# Patient Record
Sex: Female | Born: 1935 | Race: White | Hispanic: No | Smoking: Never smoker
Health system: Southern US, Community
[De-identification: ages and names within clinical notes are randomized; demographics above are authoritative.]

## PROBLEM LIST (undated history)

## (undated) DIAGNOSIS — G20A1 Parkinson's disease without dyskinesia, without mention of fluctuations: Secondary | ICD-10-CM

## (undated) DIAGNOSIS — K5900 Constipation, unspecified: Secondary | ICD-10-CM

## (undated) DIAGNOSIS — G259 Extrapyramidal and movement disorder, unspecified: Secondary | ICD-10-CM

## (undated) DIAGNOSIS — F0390 Unspecified dementia without behavioral disturbance: Secondary | ICD-10-CM

## (undated) DIAGNOSIS — K4090 Unilateral inguinal hernia, without obstruction or gangrene, not specified as recurrent: Secondary | ICD-10-CM

## (undated) DIAGNOSIS — G20 Parkinson's disease: Secondary | ICD-10-CM

## (undated) DIAGNOSIS — F419 Anxiety disorder, unspecified: Secondary | ICD-10-CM

## (undated) DIAGNOSIS — R413 Other amnesia: Secondary | ICD-10-CM

## (undated) DIAGNOSIS — M1990 Unspecified osteoarthritis, unspecified site: Secondary | ICD-10-CM

## (undated) HISTORY — PX: BACK SURGERY: SHX140

## (undated) HISTORY — PX: COLONOSCOPY: SHX174

## (undated) HISTORY — DX: Extrapyramidal and movement disorder, unspecified: G25.9

## (undated) HISTORY — PX: HERNIA REPAIR: SHX51

## (undated) HISTORY — DX: Other amnesia: R41.3

## (undated) HISTORY — PX: EYE SURGERY: SHX253

---

## 2010-03-12 ENCOUNTER — Encounter: Payer: Self-pay | Admitting: Gastroenterology

## 2010-09-04 ENCOUNTER — Encounter (INDEPENDENT_AMBULATORY_CARE_PROVIDER_SITE_OTHER): Payer: Self-pay | Admitting: *Deleted

## 2010-09-12 ENCOUNTER — Ambulatory Visit: Payer: Self-pay | Admitting: Gastroenterology

## 2010-09-12 DIAGNOSIS — G20 Parkinson's disease: Secondary | ICD-10-CM

## 2010-09-12 DIAGNOSIS — K5900 Constipation, unspecified: Secondary | ICD-10-CM | POA: Insufficient documentation

## 2010-09-12 DIAGNOSIS — Z860100 Personal history of colon polyps, unspecified: Secondary | ICD-10-CM | POA: Insufficient documentation

## 2010-09-12 DIAGNOSIS — M549 Dorsalgia, unspecified: Secondary | ICD-10-CM | POA: Insufficient documentation

## 2010-09-12 DIAGNOSIS — F411 Generalized anxiety disorder: Secondary | ICD-10-CM | POA: Insufficient documentation

## 2010-09-12 DIAGNOSIS — R112 Nausea with vomiting, unspecified: Secondary | ICD-10-CM | POA: Insufficient documentation

## 2010-09-13 DIAGNOSIS — R634 Abnormal weight loss: Secondary | ICD-10-CM | POA: Insufficient documentation

## 2010-09-14 ENCOUNTER — Telehealth: Payer: Self-pay | Admitting: Gastroenterology

## 2010-09-21 ENCOUNTER — Encounter: Payer: Self-pay | Admitting: Gastroenterology

## 2010-09-28 ENCOUNTER — Telehealth: Payer: Self-pay | Admitting: Gastroenterology

## 2010-10-01 ENCOUNTER — Ambulatory Visit (HOSPITAL_COMMUNITY): Admission: RE | Admit: 2010-10-01 | Discharge: 2010-10-01 | Payer: Self-pay | Admitting: Specialist

## 2010-10-02 ENCOUNTER — Encounter: Payer: Self-pay | Admitting: Gastroenterology

## 2010-10-09 ENCOUNTER — Telehealth: Payer: Self-pay | Admitting: Gastroenterology

## 2010-10-22 ENCOUNTER — Telehealth: Payer: Self-pay | Admitting: Gastroenterology

## 2010-10-24 ENCOUNTER — Encounter: Payer: Self-pay | Admitting: Gastroenterology

## 2010-11-26 ENCOUNTER — Telehealth: Payer: Self-pay | Admitting: Gastroenterology

## 2011-01-15 NOTE — Letter (Signed)
Summary: Results Letter  [facility]  [address] [place], [place] [address]   Phone: [phone]  Fax: [phone]        September 12, 2010 MRN: [id number]    Heather Mora [address] [place], [place]  [address]    Dear [name],  It is my pleasure to have treated you recently as a new patient in my office. I appreciate your confidence and the opportunity to participate in your care.  Since I do have a busy inpatient endoscopy schedule and office schedule, my office hours vary weekly. I am, however, available for emergency calls everyday through my office. If I am not available for an urgent office appointment, another one of our gastroenterologist will be able to assist you.  My well-trained staff are prepared to help you at all times. For emergencies after office hours, a physician from our Gastroenterology section is always available through my 24 hour answering service  Once again I welcome you as a new patient and I look forward to a happy and healthy relationship             Sincerely,  Louis Meckel MD  This letter has been electronically signed by your physician.  Appended Document: Results Letter letter mailed

## 2011-01-15 NOTE — Progress Notes (Signed)
Summary: Nutrition consult  ---- Converted from flag ---- ---- 09/28/2010 9:06 AM, Louis Meckel MD wrote: Please be sure the patient has a nutrition consult and a followup appointment within the next 3-4 weeks ------------------------------  Phone Note Outgoing Call Call back at [facility] Phone [phone]   Call placed by: [name] CMA [name]),  September 28, 2010 9:15 AM Summary of Call: [name] to see if pt had been seen yet, they stated that the pt has not returned any of there phone calls. I spoke with the pt's daughter and she said they have not cotacted them. Pts daughter is to call Alden Server back at [phone] to get her moms appointment scheduled..... Initial call taken by: [name] CMA [name]),  September 28, 2010 9:17 AM

## 2011-01-15 NOTE — Miscellaneous (Signed)
  Clinical Lists Changes Old medical records reviewed.  Patient has well-established gastroparesis.  She is taking domperidone in the past without improvement.  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  She underwent Botox injection of the pyloric sphincter without improvement.

## 2011-01-15 NOTE — Progress Notes (Signed)
Summary: Ct scan   Phone Note Call from Patient   Caller: Daughter carolyn  [phone] Call For: Dr. Arlyce Dice Reason for Call: Lab or Test Results Summary of Call: Calling about CT scan that was left with Dr. Arlyce Dice on the last visit Initial call taken by: Karna Christmas,  October 09, 2010 10:17 AM  Follow-up for Phone Call        Returned pts daughters call. L/M for her that we still have the pts CT scan on the disk that was left if she wants to come by and pick them up Follow-up by: [name] CMA [name]),  October 09, 2010 12:02 PM

## 2011-01-15 NOTE — Letter (Signed)
Summary: New Patient letter  [facility]  [address] [place], [place] [address]   Phone: [phone]  Fax: [phone]       09/04/2010 MRN: [id number]  Heather Mora [address] [place], [place]  [address]  Dear Ms. Hommes,  Welcome to the Gastroenterology Division at [facility].    You are scheduled to see Dr.  Arlyce Dice on 09-12-10 at 3:00p.m. on the 3rd floor at [facility], [address]  We ask that you try to arrive at our office 15 minutes prior to your appointment time to allow for check-in.  We would like you to complete the enclosed self-administered evaluation form prior to your visit and bring it with you on the day of your appointment.  We will review it with you.  Also, please bring a complete list of all your medications or, if you prefer, bring the medication bottles and we will list them.  Please bring your insurance card so that we may make a copy of it.  If your insurance requires a referral to see a specialist, please bring your referral form from your primary care physician.  Co-payments are due at the time of your visit and may be paid by cash, check or credit card.     Your office visit will consist of a consult with your physician (includes a physical exam), any laboratory testing he/she may order, scheduling of any necessary diagnostic testing (e.g. x-ray, ultrasound, CT-scan), and scheduling of a procedure (e.g. Endoscopy, Colonoscopy) if required.  Please allow enough time on your schedule to allow for any/all of these possibilities.    If you cannot keep your appointment, please call [phone] to cancel or reschedule prior to your appointment date.  This allows [place] the opportunity to schedule an appointment for another patient in need of care.  If you do not cancel or reschedule by 5 p.m. the business day prior to your appointment date, you will be charged a $50.00 late cancellation/no-show fee.    Thank you for choosing  [facility] for your medical needs.  We appreciate the opportunity to care for you.  Please visit [place] at our website  to learn more about our practice.                     Sincerely,                                                             The Gastroenterology Division

## 2011-01-15 NOTE — Progress Notes (Signed)
Summary: Medication refill  Phone Note Call from Patient Call back at Home Phone [phone]   Caller: Patient Call For: Dr. Arlyce Dice Reason for Call: Refill Medication Summary of Call: Needs refill on his Fentanyl Initial call taken by: Karna Christmas,  October 09, 2010 4:45 PM  Follow-up for Phone Call        Can pt have refill on Fentynal patch? Follow-up by: [name] CMA [name]),  October 09, 2010 4:49 PM  Additional Follow-up for Phone Call Additional follow up Details #1::        Can refill this time but tell her that she needs to have it done by her PCP in the future. Needs OV Additional Follow-up by: Louis Meckel MD,  October 10, 2010 10:36 AM    Additional Follow-up for Phone Call Additional follow up Details #2::    Called pt to inform, daughter will pick up script and already has a follow up appointment with Dr Arlyce Dice on 11/20/2010 Follow-up by: [name] CMA [name]),  October 10, 2010 10:49 AM  New/Updated Medications: DURAGESIC-25 25 MCG/HR PT72 (FENTANYL) apply every 72 hours Prescriptions: DURAGESIC-25 25 MCG/HR PT72 (FENTANYL) apply every 72 hours  #15 x 0   Entered by:   [name] CMA (AAMA)   Authorized by:   Louis Meckel MD   Signed by:   [name] CMA (AAMA) on 10/10/2010   Method used:   Printed then faxed to ...       [facility]  [place] [address]* (retail)       [address]       [place], [place]  [address]       Ph: [phone] or [phone]       Fax: [phone]   RxID:   [id number]

## 2011-01-15 NOTE — Progress Notes (Signed)
Summary: [facility]  Phone Note Outgoing Call Call back at [facility] Phone [phone] Call back at [facility] [phone]   Call placed by: [name] CMA [name]),  September 14, 2010 12:48 PM Summary of Call: Called to make sure that they recieved information on pt. They recieved all faxed info and ae working on getting her scheduled in. Will contact me as soon as they schedule her, Also faxed Dr Arlyce Dice cell # for the doctor to contact Dr Arlyce Dice Initial call taken by: [name] CMA [name]),  September 14, 2010 12:53 PM  Follow-up for Phone Call        FYI pt has an appointment with [facility] on Friday 10/7 at 2pm  Pt aware. Follow-up by: [name] CMA [name]),  September 17, 2010 9:05 AM  Additional Follow-up for Phone Call Additional follow up Details #1::        ok Additional Follow-up by: Louis Meckel MD,  September 17, 2010 9:15 AM

## 2011-01-15 NOTE — Letter (Signed)
Summary: [facility]  [facility]   Imported By: Sherian Rein 10/03/2010 10:25:07  _____________________________________________________________________  External Attachment:    Type:   Image     Comment:   External Document

## 2011-01-15 NOTE — Progress Notes (Signed)
Summary: Phone Message  Phone Note Other Incoming Call back at [facility] Phone [phone]   Caller: Patient's daughter Details for Reason: CT Scan Results Summary of Call: Pt's daughter, Heather Mora, called regarding CT scan. She wanted to know if Dr. Arlyce Dice had read them and what his findings were. Please give daughter a call. Initial call taken by: Schuyler Amor,  October 22, 2010 10:49 AM  Follow-up for Phone Call        Dr Arlyce Dice, We dont have a written report we have the disk, Did you review it? I still have it if you need to look at it.  Follow-up by: [name] CMA [name]),  October 22, 2010 11:18 AM  Additional Follow-up for Phone Call Additional follow up Details #1::        Converted from flag ---- ---- 10/23/2010 9:35 AM, Louis Meckel MD wrote: Please leave me the disc ------------------------------  I put it on your desk Additional Follow-up by: [name] CMA [name]),  October 23, 2010 9:43 AM    Additional Follow-up for Phone Call Additional follow up Details #2::    I reviewed the CT scan.  There no specific findings referable to her GI tract.  She needs followup GI office visit  Follow-up by: Louis Meckel MD,  October 24, 2010 10:00 AM  Additional Follow-up for Phone Call Additional follow up Details #3:: Details for Additional Follow-up Action Taken: Contacted pts daughter to inform her of the CT Scan. Pt has an appointment with Dr Arlyce Dice on 12-6 will pick up CT Disk at that time Additional Follow-up by: [name] CMA [facility]),  October 25, 2010 8:30 AM

## 2011-01-15 NOTE — Assessment & Plan Note (Signed)
Summary: ESTABLISH G.I//NO APPETITE--CH.   History of Present Illness Visit Type: Initial Visit Primary GI MD: Melvia Heaps MD [facility] Primary Rodderick Holtzer: n/a Requesting Ryoma Nofziger: n/a Chief Complaint: Patient lives here every 3 months and lives in [place] the other times. Patinet was on Domperidone for gastroparesis but was told to stop due to her Parkinson. She has a complex history and she brought her records. She has constant nausea she has had the botox injections and is very limited as to what she can take due pt her Parkinsons. She only eats one egg a day and drinks 2 ensures a day.   History of Present Illness:   Heather Mora is a pleasant 77 year old white female self-referred for evaluation of anorexia.  She has had Parkinson's disease for 8 years.  She has  gastroparesis and has been extensively worked up in [place].  She went to a motility center at [facility] where it was felt that she was not a candidate for gastric pacemaker.  She had taking domperidone without improvement and was eventually told to stop this because of her Parkinson's.  She has constant anorexia, nausea and vomiting..  She typically will have 18 and  ensures daily.  She has lost 10 pounds over the past year but another 10 pounds over the last 2 months.  She underwent a Botox injection of her pylorus without improvement in her symptoms.  In the last 3 months her  neurologic disease has significantly worsened,  according to her daughter.  She is having constant movements.  She also suffers from severe anxiety.  She denies abdominal pain but complains of constipation.  Upper endoscopy 2 weeks ago in [place] demonstrated atrophic gastritis and reflux esophagitis.  She apparently underwent colonoscopy2  to 3 years ago because of family history of colon cancer and polyps in 2 of her siblings.  She is on Percocet 4 times a day for chronic back pain.   She takes Sinemet 25-250 2-1/2 tabs 3 times a day.   A prealbumin  level drawn 2 weeks ago was normal.  She denies dysphagia.   GI Review of Systems    Reports abdominal pain, belching, bloating, loss of appetite, nausea, vomiting, and  weight loss.     Location of  Abdominal pain: lower abdomen. Weight loss of 8-10lbs pounds over 3 months.   Denies acid reflux, chest pain, dysphagia with liquids, dysphagia with solids, heartburn, vomiting blood, and  weight gain.      Reports constipation.     Denies anal fissure, black tarry stools, change in bowel habit, diarrhea, diverticulosis, fecal incontinence, heme positive stool, hemorrhoids, irritable bowel syndrome, jaundice, light color stool, liver problems, rectal bleeding, and  rectal pain. Preventive Screening-Counseling & Management  Alcohol-Tobacco     Smoking Status: never      Drug Use:  no.      Current Medications (verified): 1)  Sinemet 25-250 Mg Tabs (Carbidopa-Levodopa) .... Take 2.5 Tabs By Mouth Every Morning, 2 Tabs in The Afternoon, and 2 Tabs By Mouth At Bedtime 2)  Norco 10-325 Mg Tabs (Hydrocodone-Acetaminophen) .... Take One By Mouth Four Times A Day 3)  Alprazolam 0.25 Mg Tabs (Alprazolam) .... Take One By Mouth Two Times A Day 4)  Paxil 10 Mg Tabs (Paroxetine Hcl) .... Take One By Mouth At Bedtime 5)  Ambien 10 Mg Tabs (Zolpidem Tartrate) .... Take 1/2 By Mouth At Bedtime 6)  Carafate 1 Gm/62ml Susp (Sucralfate) .... Take 2 Teaspoons Before Meals and  Bed  Allergies (verified): No Known Drug Allergies  Past History:  Past Medical History: Parkinsons chroinc back pain gastroparesis  Hypertension colon polyps.  Past Surgical History: back surgery hernia surgery 03/2010  Family History: Family History of Colon Cancer: brothers Family History of Colon Polyps:brothers Family History of Heart Disease: brother Parkinsons: brother  Social History: Occupation: retired one son and three girls Patient has never smoked.  Alcohol Use - no Illicit Drug Use - no Smoking  Status:  never Drug Use:  no  Review of Systems       The patient complains of anxiety-new, arthritis/joint pain, back pain, confusion, depression-new, fatigue, muscle pains/cramps, night sweats, shortness of breath, sleeping problems, thirst - excessive, and urination changes/pain.  The patient denies allergy/sinus, anemia, blood in urine, breast changes/lumps, change in vision, cough, coughing up blood, fainting, fever, headaches-new, hearing problems, heart murmur, heart rhythm changes, itching, menstrual pain, nosebleeds, pregnancy symptoms, skin rash, sore throat, swelling of feet/legs, swollen lymph glands, thirst - excessive , urination - excessive , urine leakage, vision changes, and voice change.    Vital Signs:  Patient profile:   77 year old female Height:      63 inches Weight:      81.2 pounds BMI:     14.44 Pulse rate:   80 / minute Pulse rhythm:   regular BP sitting:   92 / 44  (left arm) Cuff size:   regular  Vitals Entered By: Harlow Mares CMA [name]) (September 12, 2010 3:39 PM)  Physical Exam  Additional Exam:  on physical exam she is a very thin female  skin: anicteric HEENT: normocephalic; PEERLA; no nasal or pharyngeal abnormalities neck: supple nodes: no cervical lymphadenopathy chest: clear to ausculatation and percussion heart: no murmurs, gallops, or rubs abd: soft, nontender; BS normoactive; no abdominal masses, tenderness, organomegaly rectal: deferred ext: no cynanosis, clubbing, edema skeletal: no deformities neuro: oriented x 3; no focal abnormalities; varicosities choreoathetoid movements    Impression & Recommendations:  Problem # 1:  NAUSEA WITH VOMITING (ICD-787.01) The patient's symptoms certainly can be explained by gastroparesis.  Constant anorexia with nausea may also be due to medication effects including her narcotics and Sinemet.  Symptoms did not improve with domperidone and one neurologists stated  this was contraindicated in the  presence of Parkinson's.  Recommendations #1 neurologic referral for reevaluation #2 begin  fentanyl patch #3 nutrition consult #4 patien tmay require a gastrostomy tube if her nutritional needs cannot be met  Problem # 2:  CONSTIPATION (ICD-564.00) Patient will try MiraLax and titrate her dose.  Constipation is related to both her Parkinson's disease and medications  Problem # 3:  FM HX MALIGNANT NEOPLASM GASTROINTESTINAL TRACT (ICD-V16.0) Assessment: Comment Only Patient is due for followup colonoscopy in approximately 3 years  Problem # 4:  PARKINSON'S DISEASE (ICD-332.0) Her choreoathetoid movements are not typical for Parkinson's disease.  This could be a medication effect from her Sinemet.  Recommendations #1 neurology consult  Problem # 5:  ANXIETY (ICD-300.00) Plan to continue alprazolam as needed  Problem # 6:  BACK PAIN, LUMBOSACRAL, CHRONIC (ICD-724.5) Plan orthopedic referral  Patient Instructions: 1)  We will call you with referral times for [facility] and for [facility] as soon as they become available 2)  The medication list was reviewed and reconciled.  All changed / newly prescribed medications were explained.  A complete medication list was provided to the patient / caregiver. Prescriptions: DURAGESIC-25 25 MCG/HR PT72 (FENTANYL) apply  every 72 hours  #15 x 0   Entered and Authorized by:   Louis Meckel MD   Signed by:   Louis Meckel MD on 09/12/2010   Method used:   Print then Give to Patient   RxID:   [id number]   Appended Document: ESTABLISH G.I//NO APPETITE--CH.    Clinical Lists Changes  Problems: Added new problem of WEIGHT LOSS, ABNORMAL (ICD-783.21)      Appended Document: ESTABLISH G.I//NO APPETITE--CH.    Clinical Lists Changes  Orders: Added new Test order of [facility] ([facility]) - Signed      Appended Document: ESTABLISH G.I//NO  APPETITE--CH.    Clinical Lists Changes  Orders: Added new Test order of Misc. Referral (Misc. Ref) - Signed [facility]     Appended Document: ESTABLISH G.I//NO APPETITE--CH.    Clinical Lists Changes  Orders: Added new Test order of [facility] ([facility] - Signed

## 2011-01-15 NOTE — Letter (Signed)
Summary: Consult/[facility]  Consult/[facility]   Imported By: Sherian Rein 10/03/2010 10:23:36  _____________________________________________________________________  External Attachment:    Type:   Image     Comment:   External Document

## 2011-01-15 NOTE — Letter (Signed)
Summary: Patient No Show/[facility]  [facility] Show/ [facility]   Imported By: Lennie Odor 10/10/2010 13:47:35  _____________________________________________________________________  External Attachment:    Type:   Image     Comment:   External Document

## 2011-01-17 NOTE — Progress Notes (Signed)
Summary: Pickup CT scan disk  Phone Note Call from Patient Call back at Home Phone [phone]   Caller: Daughter Heather Mora Call For: Dr. Arlyce Dice Reason for Call: Talk to Nurse Summary of Call: wants to come by and pickup her CT scan disk  Follow-up for Phone Call        Spoke with Heather Mora pt to pick up disk today, Pts brother passed away this weekend and she will be in [place] until March 2012. Wants disk to have in case she needs it while in [place] Follow-up by: [name] CMA [name]),  November 26, 2010 10:56 AM

## 2013-07-26 ENCOUNTER — Encounter: Payer: Self-pay | Admitting: Diagnostic Neuroimaging

## 2013-07-26 ENCOUNTER — Ambulatory Visit (INDEPENDENT_AMBULATORY_CARE_PROVIDER_SITE_OTHER): Payer: Medicare Other | Admitting: Diagnostic Neuroimaging

## 2013-07-26 VITALS — BP 145/78 | HR 83 | Ht 64.0 in | Wt 83.0 lb

## 2013-07-26 DIAGNOSIS — G20A1 Parkinson's disease without dyskinesia, without mention of fluctuations: Secondary | ICD-10-CM | POA: Insufficient documentation

## 2013-07-26 DIAGNOSIS — G20 Parkinson's disease: Secondary | ICD-10-CM | POA: Insufficient documentation

## 2013-07-26 DIAGNOSIS — F0280 Dementia in other diseases classified elsewhere without behavioral disturbance: Secondary | ICD-10-CM

## 2013-07-26 NOTE — Patient Instructions (Signed)
Continue current meds.  Try physical therapy in Oct 2014.

## 2013-07-26 NOTE — Progress Notes (Addendum)
[facility]  PATIENT: Heather Mora DOB: Apr 03, 1930  REFERRING CLINICIAN:  HISTORY FROM: patient and daughter REASON FOR VISIT: follow up   HISTORICAL  CHIEF COMPLAINT:  Chief Complaint  Patient presents with  . Follow-up    PD RV#[id number]    HISTORY OF PRESENT ILLNESS:   UPDATE 07/26/13: Since last visit patient is complaining of progressive Parkinson's disease. She feels that her balance and memory had deteriorated. Now patient is on Neupro patch, Exelon patch, Namenda XR 20 mg daily. Patient still on carbidopa/levodopa 25/101.5 tablets 3 times per day. She was tried on 50/200 extended release tablet at bedtime to help with early morning freezing and slowness, but this caused insomnia and dyskinesia. Patient continues to gradually lose weight. She's lost another 5 pounds over last 2 years. She feels low but off-balance. Unfortunately she's not having falls. Seeing Dr. Donell Beers (psych).  UPDATE 08/21/11: Daughter noticing memory problems.  Pt never increased sinemet dosing from last visit.  Still with GI upset.  Afternoons are better.  No wearing off b/w dosing.  Pt getting ready to go to [place] next week for 2-3 months.  UPDATE 03/21/11: Doing about the same.  Eating better.  Still with some slowness in AM, gets better in the afternoon/evening.  No wearing off.  Some memory problems.  UPDATE 11/14/10: Doing much better than last visit.  PO intake better.  on sinemet 25/100 1.5 tabs TID (7:30 AM, 12:30 PM, 6:30 PM); asking if she can take 2 tabs in the AM, b/c of bradykinesia in AM.  No wearing off b/w doses.  No tremor.  Pain and spasm controlled on fentanyl and flexeril.  Some report of BP fluctuation.  UPDATE 09/21/10: Patient has had several setbacks and overall decline.  Nausea is persistent.  PO intake is poor.  Weight loss continue.  Also Several months ago she began to develop increasing dyskinesias.  This was particularly evident after her pain medication and  anti-anxiety medication had been increased. She was previously on Sinemet 25/100 (2.5, 2, 2 tabs) but now her daughter reduced this to 1.5 tabs TID (7:30 AM, 12:30 PM, 6:30 PM), and her dyskinesias have largely subsided.  Yesterday she started fentanyl patch , and this morning had much worsened nausea and confusion.  Patch was removed and now things are starting to improve.  Dyskinesias are present now, but as bad as they were on higher sinemet.  Right now the patient's top 3 complaints are: 1. back pain 2. anxiety and 3. nausea.  PRIOR HPI: 79 year old right-handed female with history of Parkinson's disease presenting for second opinion evaluation.  Patient is accompanied by her daughter.  Patient is normally seen by her neurologist Dr. Criss Rosales in [place].  The patient developed left thumb tremor approximately 7 years ago. She was subsequently diagnosed with Parkinson's disease and started on Sinemet.  Initially she had good response. Approximately 3 years ago she had to increase her Sinemet dose because she was having reduced efficacy.  Past one to 2 years she has developed intractable nausea, gastroparesis and constipation. She has had extensive GI evaluation, testing and treatment, including Botox injections.  Currently she is on domperidone 10mg  (2 tabs TID).  Currently she is on Sinemet 25/100, 2.5 tabs in the morning, 2 tablets at noon and 2 tabs in the evening.  She does have slowness and stiffness of movements in the morning before she takes her medication.  She achieves good relief of symptoms 2 hours after the first dose. By  noon she exhibits wearing off symptoms. She does not have significant wearing off symptoms prior to her evening dose.  She has tried azilect in the past, but had to stop because of cost.  She has not been on requip or mirapex.  She has been taking coenzyme Q10.  She has been having problems with anxiety and panic attacks recently. This happened when she was at dinner with her  family last week.  REVIEW OF SYSTEMS: Full 14 system review of systems performed and notable only for palpitations blurred vision eye pain and constipation aching muscle increased thirst anxiety tremor dizziness weakness numbness headache confusion memory loss.  ALLERGIES: No Known Allergies  HOME MEDICATIONS: Prior to Admission medications   Medication Sig Start Date End Date Taking? Authorizing Provider  rivastigmine (EXELON) 4.6 mg/24hr Place 1 patch onto the skin daily.   Yes Historical Provider, MD  carbidopa-levodopa (SINEMET CR) 50-200 MG per tablet  05/25/13   Historical Provider, MD  cyclobenzaprine (FLEXERIL) 10 MG tablet  07/20/13   Historical Provider, MD  LORazepam (ATIVAN) 0.5 MG tablet  07/21/13   Historical Provider, MD  NAMENDA XR 28 MG CP24  07/21/13   Historical Provider, MD  NEUPRO 4 MG/24HR  07/06/13   Historical Provider, MD   No outpatient prescriptions prior to visit.   No facility-administered medications prior to visit.    PAST MEDICAL HISTORY: Past Medical History  Diagnosis Date  . Movement disorder   . Memory loss     PAST SURGICAL HISTORY: Past Surgical History  Procedure Laterality Date  . Hernia repair    . Back surgery      FAMILY HISTORY: Family History  Problem Relation Age of Onset  . Parkinsonism Brother     SOCIAL HISTORY:  History   Social History  . Marital Status: Widowed    Spouse Name: N/A    Number of Children: N/A  . Years of Education: N/A   Occupational History  . Not on file.   Social History Main Topics  . Smoking status: Not on file  . Smokeless tobacco: Not on file  . Alcohol Use: Not on file  . Drug Use: Not on file  . Sexually Active: Not on file   Other Topics Concern  . Not on file   Social History Narrative   79year old right handed female wo is retired. She has a high school education level.  She is widowed.  She has one alcoholic drink daily. Drinks  1 cup tea daily     PHYSICAL EXAM  Filed  Vitals:   07/26/13 1152  BP: 145/78  Pulse: 83  Height: 5\' 4"  (1.626 m)  Weight: 83 lb (37.649 kg)    Not recorded    Body mass index is 14.24 kg/(m^2).  GENERAL EXAM: General: Patient is awake, alert and in no acute distress.  Well developed and groomed.  THIN FEMALE. Cardiovascular: Heart is regular rate and rhythm with no murmurs. 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  Neurologic Exam  Mental Status: Awake, alert.  Language is fluent and comprehension intact. MMSE 24/30. AFT 5. GDS 3. Cranial Nerves: Pupils are equal and reactive to light.  Visual fields are full to confrontation.  Conjugate eye movements are full and symmetric.  Facial sensation and strength are symmetric.  Hearing is intact.  Palate elevated symmetrically and uvula is midline.  Shoulder shug is symmetric.  Tongue is midline. Motor: DECREASED BULK. Normal tone.  Full strength in the upper and  lower extremities.  No pronator drift. BRADYKINESIA IN LUE AND LLE.  MILD COGWHEELING IN BUE. NO TREMOR OR DYSKINESIA. Sensory: Intact and symmetric to light touch. Coordination: No ataxia or dysmetria on finger-nose or rapid alternating movement testing. Gait and Station: Narrow based gait. SLOW, POOR ARM SWING.  Reflexes: Deep tendon reflexes in the upper and lower extremity are present and symmetric.   DIAGNOSTIC DATA (LABS, IMAGING, TESTING) - I reviewed patient records, labs, notes, testing and imaging myself where available.  No results found for this basename: WBC, HGB, HCT, MCV, PLT   No results found for this basename: na, k, cl, co2, glucose, bun, creatinine, calcium, prot, albumin, ast, alt, alkphos, bilitot, gfrnonaa, gfraa   No results found for this basename: CHOL, HDL, LDLCALC, LDLDIRECT, TRIG, CHOLHDL   No results found for this basename: HGBA1C   No results found for this basename: VITAMINB12   No results found for this basename: TSH     ASSESSMENT AND PLAN  79 y.o. with  progressive Parkinson's disease. Motor symptoms stable. No major wearing off for now. Some non-motor symptoms BP fluctuation and anxiety.  She had to stop azilect due to cost.     PLAN: 1. Continue carbidopa/levodopa 25/100 (1.5tabs TID) and neupro patch 2. Continue namenda and exelon for dementia/memory 3. PT evaluation (Oct 2014)   Return in about 6 months (around 01/26/2014) for with Heide Guile or Penumalli.    Suanne Marker, MD 07/26/2013, 12:56 PM Certified in Neurology, Neurophysiology and Neuroimaging  [facility] [address], [address] [place], [place] [address] [phone]

## 2013-12-02 ENCOUNTER — Ambulatory Visit: Payer: Self-pay | Admitting: Diagnostic Neuroimaging

## 2013-12-30 ENCOUNTER — Ambulatory Visit: Payer: Medicare Other | Admitting: Diagnostic Neuroimaging

## 2014-04-18 ENCOUNTER — Ambulatory Visit: Payer: Self-pay | Admitting: Diagnostic Neuroimaging

## 2014-10-24 ENCOUNTER — Encounter (HOSPITAL_COMMUNITY): Payer: Self-pay | Admitting: Emergency Medicine

## 2014-10-24 ENCOUNTER — Emergency Department (HOSPITAL_COMMUNITY)
Admission: EM | Admit: 2014-10-24 | Discharge: 2014-10-25 | Disposition: A | Discharge status: Home / Self Care | Payer: Medicare Other | Attending: Emergency Medicine | Admitting: Emergency Medicine

## 2014-10-24 ENCOUNTER — Emergency Department (HOSPITAL_COMMUNITY): Payer: Medicare Other

## 2014-10-24 DIAGNOSIS — Y92009 Unspecified place in unspecified non-institutional (private) residence as the place of occurrence of the external cause: Secondary | ICD-10-CM | POA: Insufficient documentation

## 2014-10-24 DIAGNOSIS — S42212A Unspecified displaced fracture of surgical neck of left humerus, initial encounter for closed fracture: Secondary | ICD-10-CM | POA: Insufficient documentation

## 2014-10-24 DIAGNOSIS — W1830XA Fall on same level, unspecified, initial encounter: Secondary | ICD-10-CM | POA: Diagnosis not present

## 2014-10-24 DIAGNOSIS — S59912A Unspecified injury of left forearm, initial encounter: Secondary | ICD-10-CM | POA: Diagnosis not present

## 2014-10-24 DIAGNOSIS — G20 Parkinson's disease: Secondary | ICD-10-CM | POA: Insufficient documentation

## 2014-10-24 DIAGNOSIS — Z79899 Other long term (current) drug therapy: Secondary | ICD-10-CM | POA: Insufficient documentation

## 2014-10-24 DIAGNOSIS — Y998 Other external cause status: Secondary | ICD-10-CM | POA: Diagnosis not present

## 2014-10-24 DIAGNOSIS — S4992XA Unspecified injury of left shoulder and upper arm, initial encounter: Secondary | ICD-10-CM | POA: Diagnosis present

## 2014-10-24 DIAGNOSIS — S299XXA Unspecified injury of thorax, initial encounter: Secondary | ICD-10-CM | POA: Diagnosis not present

## 2014-10-24 DIAGNOSIS — Y9389 Activity, other specified: Secondary | ICD-10-CM | POA: Diagnosis not present

## 2014-10-24 DIAGNOSIS — S42302A Unspecified fracture of shaft of humerus, left arm, initial encounter for closed fracture: Secondary | ICD-10-CM

## 2014-10-24 DIAGNOSIS — R52 Pain, unspecified: Secondary | ICD-10-CM

## 2014-10-24 DIAGNOSIS — W19XXXA Unspecified fall, initial encounter: Secondary | ICD-10-CM

## 2014-10-24 HISTORY — DX: Parkinson's disease: G20

## 2014-10-24 HISTORY — DX: Parkinson's disease without dyskinesia, without mention of fluctuations: G20.A1

## 2014-10-24 LAB — CBC WITH DIFFERENTIAL/PLATELET
Basophils Absolute: 0 10*3/uL (ref 0.0–0.1)
Basophils Relative: 1 % (ref 0–1)
EOS PCT: 1 % (ref 0–5)
Eosinophils Absolute: 0.1 10*3/uL (ref 0.0–0.7)
HEMATOCRIT: 31.3 % — AB (ref 36.0–46.0)
HEMOGLOBIN: 10.6 g/dL — AB (ref 12.0–15.0)
LYMPHS ABS: 1.2 10*3/uL (ref 0.7–4.0)
LYMPHS PCT: 20 % (ref 12–46)
MCH: 30.9 pg (ref 26.0–34.0)
MCHC: 33.9 g/dL (ref 30.0–36.0)
MCV: 91.3 fL (ref 78.0–100.0)
MONO ABS: 0.4 10*3/uL (ref 0.1–1.0)
MONOS PCT: 6 % (ref 3–12)
Neutro Abs: 4.4 10*3/uL (ref 1.7–7.7)
Neutrophils Relative %: 72 % (ref 43–77)
Platelets: 254 10*3/uL (ref 150–400)
RBC: 3.43 MIL/uL — AB (ref 3.87–5.11)
RDW: 13 % (ref 11.5–15.5)
WBC: 6.1 10*3/uL (ref 4.0–10.5)

## 2014-10-24 LAB — COMPREHENSIVE METABOLIC PANEL
ALT: <5 U/L (ref 0–35)
AST: 24 U/L (ref 0–37)
Albumin: 3.6 g/dL (ref 3.5–5.2)
Alkaline Phosphatase: 141 U/L — ABNORMAL HIGH (ref 39–117)
Anion gap: 13 (ref 5–15)
BILIRUBIN TOTAL: 0.2 mg/dL — AB (ref 0.3–1.2)
BUN: 24 mg/dL — AB (ref 6–23)
CALCIUM: 9.3 mg/dL (ref 8.4–10.5)
CO2: 24 meq/L (ref 19–32)
CREATININE: 0.5 mg/dL (ref 0.50–1.10)
Chloride: 97 mEq/L (ref 96–112)
GFR, EST NON AFRICAN AMERICAN: 88 mL/min — AB (ref >90)
GLUCOSE: 101 mg/dL — AB (ref 70–99)
Potassium: 3.9 mEq/L (ref 3.7–5.3)
Sodium: 134 mEq/L — ABNORMAL LOW (ref 137–147)
Total Protein: 6.7 g/dL (ref 6.0–8.3)

## 2014-10-24 MED ORDER — OXYCODONE-ACETAMINOPHEN 5-325 MG PO TABS
1.0000 | ORAL_TABLET | Freq: Once | ORAL | Status: AC
  Administered 2014-10-25: 1 via ORAL
  Filled 2014-10-24: qty 1

## 2014-10-24 NOTE — ED Notes (Signed)
Resident MD at bedside.

## 2014-10-24 NOTE — ED Notes (Signed)
Pt. lost her balance and fell at home this evening , reports pain at left shoulder and left arm pain . No LOC , alert and oriented ,respirations unlabored .

## 2014-10-25 DIAGNOSIS — S42212A Unspecified displaced fracture of surgical neck of left humerus, initial encounter for closed fracture: Secondary | ICD-10-CM | POA: Diagnosis not present

## 2014-10-25 MED ORDER — OXYCODONE-ACETAMINOPHEN 5-325 MG PO TABS: 2.0000 | ORAL_TABLET | Freq: Four times a day (QID) | ORAL | Status: AC | PRN

## 2014-10-25 NOTE — ED Provider Notes (Signed)
CSN: [id number]     Arrival date & time 10/24/14  2100 History   First MD Initiated Contact with Patient 10/24/14 2229     Chief Complaint  Patient presents with  . Fall  . Shoulder Pain     (Consider location/radiation/quality/duration/timing/severity/associated sxs/prior Treatment) Patient is a 80 y.o. female presenting with fall and shoulder pain. The history is provided by the patient and a relative. No language interpreter was used.  Fall This is a recurrent problem. The current episode started today. The problem occurs intermittently. The problem has been resolved. Pertinent negatives include no abdominal pain, chest pain, diaphoresis, fever, headaches, neck pain, numbness, vertigo, visual change or weakness. Associated symptoms comments: Gait instability due to Parkinson's disease. Exacerbated by: movement of left arm. She has tried nothing for the symptoms.  Shoulder Pain Location:  Arm Injury: yes   Mechanism of injury: fall   Fall:    Fall occurred:  Standing   Height of fall:  2 ft   Impact surface:  Hard floor   Point of impact: left arm.   Entrapped after fall: no   Arm location:  L upper arm Pain details:    Quality:  Aching   Radiates to:  Does not radiate   Severity:  Severe   Onset quality:  Sudden   Progression:  Unchanged Chronicity:  New Dislocation: no   Prior injury to area:  No Relieved by:  Immobilization Worsened by:  Movement Associated symptoms: decreased range of motion ( due to pain)   Associated symptoms: no back pain, no fever, no neck pain, no numbness and no tingling   Risk factors: no concern for non-accidental trauma     Past Medical History  Diagnosis Date  . Movement disorder   . Memory loss   . Parkinson disease    Past Surgical History  Procedure Laterality Date  . Hernia repair    . Back surgery     Family History  Problem Relation Age of Onset  . Parkinsonism Brother    History  Substance Use Topics  . Smoking status:  Never Smoker   . Smokeless tobacco: Not on file  . Alcohol Use: No   OB History    No data available     Review of Systems  Constitutional: Negative for fever and diaphoresis.  Eyes: Negative for visual disturbance.  Respiratory: Negative for chest tightness and shortness of breath.   Cardiovascular: Negative for chest pain and palpitations.  Gastrointestinal: Negative for abdominal pain.  Musculoskeletal: Negative for back pain and neck pain.  Skin: Negative for wound.  Neurological: Negative for dizziness, vertigo, syncope, weakness, numbness and headaches.  Hematological: Does not bruise/bleed easily.  All other systems reviewed and are negative.     Allergies  Review of patient's allergies indicates no known allergies.  Home Medications   Prior to Admission medications   Medication Sig Start Date End Date Taking? Authorizing Provider  carbidopa-levodopa (SINEMET CR) 50-200 MG per tablet Take 1 tablet by mouth 3 (three) times daily.   Yes Historical Provider, MD  cyclobenzaprine (FLEXERIL) 10 MG tablet Take 10 mg by mouth 2 (two) times daily.  07/20/13  Yes Historical Provider, MD  ibuprofen (ADVIL,MOTRIN) 200 MG tablet Take 200 mg by mouth every 6 (six) hours as needed for mild pain.   Yes Historical Provider, MD  LORazepam (ATIVAN) 0.5 MG tablet Take 0.5 mg by mouth at bedtime as needed.  07/21/13  Yes Historical Provider, MD  MELATONIN PO Take  1 tablet by mouth at bedtime.   Yes Historical Provider, MD  NAMENDA XR 28 MG CP24 Take 28 mg by mouth daily.  07/21/13  Yes Historical Provider, MD  NEUPRO 4 MG/24HR Place 1 patch onto the skin daily.  07/06/13  Yes Historical Provider, MD  rivastigmine (EXELON) 4.6 mg/24hr Place 1 patch onto the skin daily.   Yes Historical Provider, MD  oxyCODONE-acetaminophen (PERCOCET/ROXICET) 5-325 MG per tablet Take 2 tablets by mouth every 6 (six) hours as needed for severe pain. 10/24/14   Abagail KitchensMegan Lewi Drost, MD   BP 175/82 mmHg  Pulse 84  Temp(Src)  97.6 F (36.4 C) (Oral)  Resp 20  Ht 5\' 3"  (1.6 m)  Wt 79 lb (35.834 kg)  BMI 14.00 kg/m2  SpO2 96% Physical Exam  Constitutional: She is oriented to person, place, and time. She appears well-developed and well-nourished. No distress.  HENT:  Head: Normocephalic.  Mouth/Throat: Oropharynx is clear and moist.  Eyes: EOM are normal. Pupils are equal, round, and reactive to light.  Neck: Normal range of motion.  Cardiovascular: Normal rate, regular rhythm and intact distal pulses.   Pulmonary/Chest: Effort normal and breath sounds normal. She exhibits tenderness (left lower ribs from prior fall).  Abdominal: Soft. Bowel sounds are normal. She exhibits no distension. There is no tenderness.  Musculoskeletal:       Left elbow: Normal.       Left wrist: Normal.       Right hip: Normal.       Left hip: Normal.       Cervical back: Normal.       Thoracic back: Normal.       Lumbar back: Normal.       Left upper arm: She exhibits bony tenderness.       Left forearm: She exhibits bony tenderness. She exhibits no swelling and no deformity.       Left hand: Normal. Normal sensation noted. Normal strength noted.  Intact median, radial, ulnar nerves. No tenderness or deformity of RUE and bilateral LE  Neurological: She is alert and oriented to person, place, and time.  Skin: Skin is warm and dry.  Vitals reviewed.   ED Course  Procedures (including critical care time) Labs Review Labs Reviewed  CBC WITH DIFFERENTIAL - Abnormal; Notable for the following:    RBC 3.43 (*)    Hemoglobin 10.6 (*)    HCT 31.3 (*)    All other components within normal limits  COMPREHENSIVE METABOLIC PANEL - Abnormal; Notable for the following:    Sodium 134 (*)    Glucose, Bld 101 (*)    BUN 24 (*)    Alkaline Phosphatase 141 (*)    Total Bilirubin 0.2 (*)    GFR calc non Af Amer 88 (*)    All other components within normal limits    Imaging Review Dg Chest 1 View  10/24/2014   CLINICAL DATA:  Larey SeatFell  2 weeks ago.  Left-sided rib pain since then  EXAM: CHEST - 1 VIEW  COMPARISON:  None.  FINDINGS: Heart size is normal. Mediastinal shadows are normal except for calcification of the thoracic aorta. There is no pneumothorax or hemothorax. There is chronic pleural and parenchymal scarring at the apices with calcification of the pleural surfaces. No effusion. No evidence of pneumonia or volume loss. There are old healed rib fractures on the right at ribs 8 and 9. No left-sided fracture is seen.  IMPRESSION: No active disease.  No acute finding.  Electronically Signed   By: Paulina FusiMark  Shogry M.D.   On: 10/24/2014 23:44   Dg Wrist Complete Left  10/24/2014   CLINICAL DATA:  Larey SeatFell.  Wrist pain.  EXAM: LEFT WRIST - COMPLETE 3+ VIEW  COMPARISON:  None.  FINDINGS: There is dorsal tilt of the distal radial articular surface. No cortical break is discernible, and it is probable that this configuration relates to an old healed Colles fracture. I cannot rule out a subtle component of acute compaction, but favor that the injury is old. No other sign of acute fracture.  IMPRESSION: Apparent old Colles fracture of the distal radius with dorsal tilt of the distal radial articular surface. I do not see an acute cortical break.   Electronically Signed   By: Paulina FusiMark  Shogry M.D.   On: 10/24/2014 23:25   Dg Shoulder Left  10/24/2014   CLINICAL DATA:  Status post fall tonight with a blow to the left shoulder. Left shoulder pain limited range of motion.  EXAM: LEFT SHOULDER - 2+ VIEW  COMPARISON:  None.  FINDINGS: The patient has an acute surgical neck fracture of the left humerus. There is approximately 1 shaft width anterior and 3.5 cm superior displacement of the distal fragment. The fracture does not appear to involve the tuberosities. The humeral head is located. The acromioclavicular joint is intact. Imaged left lung and ribs appear normal.  IMPRESSION: Acute surgical neck fracture left humerus as described.   Electronically Signed    By: Drusilla Kannerhomas  Dalessio M.D.   On: 10/24/2014 21:52     EKG Interpretation None      MDM   Final diagnoses:  Humerus fracture, left, closed, initial encounter    80 y/o female with recurrent falls due to gait instability (parkinson disease) with mechanical fall this evening. No LOC, dizziness, SOB, chest pain. Pain to left upper arm with XR showing acute fx of surgical neck of humerus. Closed fracture, neurovascularly intact. Mild tenderness to left forearm but imaging without acute fx/dislocation. Pelvis stable and nontender. No additional traumatic injury identified on exam. Plan for sling and f/u with Orthopedics. Will provided percocet for break through pain uncontrolled by NSAIDs. Pt lives with daughter who will be able to provided assistance and monitoring.     Abagail KitchensMegan Oluwadarasimi Redmon, MD 10/25/14 0010  Enid SkeensJoshua M Zavitz, MD 10/25/14 [phone]

## 2014-10-26 ENCOUNTER — Encounter (HOSPITAL_COMMUNITY): Payer: Self-pay | Admitting: *Deleted

## 2014-10-26 ENCOUNTER — Ambulatory Visit
Admission: RE | Admit: 2014-10-26 | Discharge: 2014-10-26 | Disposition: A | Discharge status: Home / Self Care | Payer: Medicare Other | Source: Ambulatory Visit | Attending: Orthopaedic Surgery | Admitting: Orthopaedic Surgery

## 2014-10-26 ENCOUNTER — Other Ambulatory Visit: Payer: Self-pay | Admitting: Orthopaedic Surgery

## 2014-10-26 DIAGNOSIS — T148XXA Other injury of unspecified body region, initial encounter: Secondary | ICD-10-CM

## 2014-10-26 NOTE — Progress Notes (Signed)
Pt's daughter, Crosby OysterCarolyn Constantin verified pt's allergies, meds, medical and surgical history. Given pre-op instructions.

## 2014-10-26 NOTE — H&P (Signed)
PREOPERATIVE H&P  Chief Complaint: Left proximal humerus fracture  HPI: Heather Mora is a 80 y.o. female who presents for surgical treatment of Left proximal humerus fracture.  She denies any changes in medical history.  Past Medical History  Diagnosis Date  . Movement disorder   . Memory loss   . Parkinson disease    Past Surgical History  Procedure Laterality Date  . Hernia repair    . Back surgery     History   Social History  . Marital Status: Widowed    Spouse Name: N/A    Number of Children: N/A  . Years of Education: N/A   Social History Main Topics  . Smoking status: Never Smoker   . Smokeless tobacco: Not on file  . Alcohol Use: No  . Drug Use: No  . Sexual Activity: Not on file   Other Topics Concern  . Not on file   Social History Narrative   [age]year old right handed female wo is retired. She has a high school education level.  She is widowed.  She has one alcoholic drink daily. Drinks  1 cup tea daily   Family History  Problem Relation Age of Onset  . Parkinsonism Brother    No Known Allergies Prior to Admission medications   Medication Sig Start Date End Date Taking? Authorizing Provider  carbidopa-levodopa (SINEMET CR) 50-200 MG per tablet Take 1 tablet by mouth 3 (three) times daily.    Historical Provider, MD  cyclobenzaprine (FLEXERIL) 10 MG tablet Take 10 mg by mouth 2 (two) times daily.  07/20/13   Historical Provider, MD  ibuprofen (ADVIL,MOTRIN) 200 MG tablet Take 200 mg by mouth every 6 (six) hours as needed for mild pain.    Historical Provider, MD  LORazepam (ATIVAN) 0.5 MG tablet Take 0.5 mg by mouth at bedtime.  07/21/13   Historical Provider, MD  MELATONIN PO Take 1 tablet by mouth at bedtime.    Historical Provider, MD  NAMENDA XR 28 MG CP24 Take 28 mg by mouth daily.  07/21/13   Historical Provider, MD  NEUPRO 4 MG/24HR Place 1 patch onto the skin daily.  07/06/13   Historical Provider, MD  oxyCODONE-acetaminophen (PERCOCET/ROXICET) 5-325  MG per tablet Take 2 tablets by mouth every 6 (six) hours as needed for severe pain. 10/24/14   Abagail KitchensMegan Taylor, MD  rivastigmine (EXELON) 4.6 mg/24hr Place 1 patch onto the skin daily.    Historical Provider, MD     Positive ROS: All other systems have been reviewed and were otherwise negative with the exception of those mentioned in the HPI and as above.  Physical Exam: General: Alert, no acute distress Cardiovascular: No pedal edema Respiratory: No cyanosis, no use of accessory musculature GI: abdomen soft Skin: No lesions in the area of chief complaint Neurologic: Sensation intact distally Psychiatric: Patient is competent for consent with normal mood and affect Lymphatic: no lymphedema  MUSCULOSKELETAL: exam stable  Assessment: Left proximal humerus fracture  Plan: Plan for Procedure(s): OPEN REDUCTION INTERNAL FIXATION (ORIF) LEFT PROXIMAL HUMERUS FRACTURE  The risks benefits and alternatives were discussed with the patient including but not limited to the risks of nonoperative treatment, versus surgical intervention including infection, bleeding, nerve injury,  blood clots, cardiopulmonary complications, morbidity, mortality, among others, and they were willing to proceed.   Cheral AlmasXu, [name], MD   10/26/2014 5:20 PM

## 2014-10-27 ENCOUNTER — Encounter (HOSPITAL_COMMUNITY): Payer: Self-pay | Admitting: *Deleted

## 2014-10-27 ENCOUNTER — Ambulatory Visit (HOSPITAL_COMMUNITY): Payer: Medicare Other

## 2014-10-27 ENCOUNTER — Encounter (HOSPITAL_COMMUNITY)
Admission: RE | Disposition: A | Discharge status: Home / Self Care | Payer: Self-pay | Source: Ambulatory Visit | Attending: Orthopaedic Surgery

## 2014-10-27 ENCOUNTER — Ambulatory Visit (HOSPITAL_COMMUNITY): Payer: Medicare Other | Admitting: Anesthesiology

## 2014-10-27 ENCOUNTER — Other Ambulatory Visit (HOSPITAL_COMMUNITY): Payer: Self-pay | Admitting: Orthopaedic Surgery

## 2014-10-27 ENCOUNTER — Observation Stay (HOSPITAL_COMMUNITY)
Admission: RE | Admit: 2014-10-27 | Discharge: 2014-10-28 | Disposition: A | Discharge status: Home / Self Care | Payer: Medicare Other | Source: Ambulatory Visit | Attending: Orthopaedic Surgery | Admitting: Orthopaedic Surgery

## 2014-10-27 DIAGNOSIS — W19XXXA Unspecified fall, initial encounter: Secondary | ICD-10-CM | POA: Diagnosis not present

## 2014-10-27 DIAGNOSIS — F419 Anxiety disorder, unspecified: Secondary | ICD-10-CM | POA: Diagnosis not present

## 2014-10-27 DIAGNOSIS — M1990 Unspecified osteoarthritis, unspecified site: Secondary | ICD-10-CM | POA: Insufficient documentation

## 2014-10-27 DIAGNOSIS — Y998 Other external cause status: Secondary | ICD-10-CM | POA: Insufficient documentation

## 2014-10-27 DIAGNOSIS — S42302A Unspecified fracture of shaft of humerus, left arm, initial encounter for closed fracture: Secondary | ICD-10-CM

## 2014-10-27 DIAGNOSIS — S42212A Unspecified displaced fracture of surgical neck of left humerus, initial encounter for closed fracture: Secondary | ICD-10-CM | POA: Diagnosis not present

## 2014-10-27 DIAGNOSIS — G20 Parkinson's disease: Secondary | ICD-10-CM | POA: Insufficient documentation

## 2014-10-27 DIAGNOSIS — Y9289 Other specified places as the place of occurrence of the external cause: Secondary | ICD-10-CM | POA: Diagnosis not present

## 2014-10-27 DIAGNOSIS — G2589 Other specified extrapyramidal and movement disorders: Secondary | ICD-10-CM | POA: Diagnosis not present

## 2014-10-27 DIAGNOSIS — S42209A Unspecified fracture of upper end of unspecified humerus, initial encounter for closed fracture: Secondary | ICD-10-CM | POA: Diagnosis present

## 2014-10-27 DIAGNOSIS — F0390 Unspecified dementia without behavioral disturbance: Secondary | ICD-10-CM | POA: Insufficient documentation

## 2014-10-27 DIAGNOSIS — Y9389 Activity, other specified: Secondary | ICD-10-CM | POA: Diagnosis not present

## 2014-10-27 HISTORY — DX: Unspecified osteoarthritis, unspecified site: M19.90

## 2014-10-27 HISTORY — DX: Unspecified dementia, unspecified severity, without behavioral disturbance, psychotic disturbance, mood disturbance, and anxiety: F03.90

## 2014-10-27 HISTORY — PX: ORIF HUMERUS FRACTURE: SHX2126

## 2014-10-27 HISTORY — DX: Constipation, unspecified: K59.00

## 2014-10-27 HISTORY — DX: Anxiety disorder, unspecified: F41.9

## 2014-10-27 HISTORY — DX: Unilateral inguinal hernia, without obstruction or gangrene, not specified as recurrent: K40.90

## 2014-10-27 LAB — CBC
HEMATOCRIT: 30.8 % — AB (ref 36.0–46.0)
Hemoglobin: 10.8 g/dL — ABNORMAL LOW (ref 12.0–15.0)
MCH: 32.3 pg (ref 26.0–34.0)
MCHC: 35.1 g/dL (ref 30.0–36.0)
MCV: 92.2 fL (ref 78.0–100.0)
Platelets: 199 10*3/uL (ref 150–400)
RBC: 3.34 MIL/uL — AB (ref 3.87–5.11)
RDW: 13 % (ref 11.5–15.5)
WBC: 9.2 10*3/uL (ref 4.0–10.5)

## 2014-10-27 LAB — BASIC METABOLIC PANEL
Anion gap: 16 — ABNORMAL HIGH (ref 5–15)
BUN: 24 mg/dL — ABNORMAL HIGH (ref 6–23)
CO2: 20 meq/L (ref 19–32)
Calcium: 9.5 mg/dL (ref 8.4–10.5)
Chloride: 94 mEq/L — ABNORMAL LOW (ref 96–112)
Creatinine, Ser: 0.55 mg/dL (ref 0.50–1.10)
GFR calc Af Amer: >90 mL/min (ref >90)
GFR calc non Af Amer: 86 mL/min — ABNORMAL LOW (ref >90)
GLUCOSE: 89 mg/dL (ref 70–99)
POTASSIUM: 4 meq/L (ref 3.7–5.3)
SODIUM: 130 meq/L — AB (ref 137–147)

## 2014-10-27 SURGERY — OPEN REDUCTION INTERNAL FIXATION (ORIF) PROXIMAL HUMERUS FRACTURE
Anesthesia: Regional | Site: Shoulder | Laterality: Left

## 2014-10-27 MED ORDER — PROPOFOL 10 MG/ML IV BOLUS
INTRAVENOUS | Status: DC | PRN
  Administered 2014-10-27: 80 mg via INTRAVENOUS

## 2014-10-27 MED ORDER — ADULT MULTIVITAMIN W/MINERALS CH
1.0000 | ORAL_TABLET | Freq: Every day | ORAL | Status: DC
  Administered 2014-10-28: 1 via ORAL
  Filled 2014-10-27 (×2): qty 1

## 2014-10-27 MED ORDER — LACTATED RINGERS IV SOLN
INTRAVENOUS | Status: DC | PRN
  Administered 2014-10-27 (×2): via INTRAVENOUS

## 2014-10-27 MED ORDER — IBUPROFEN 400 MG PO TABS
400.0000 mg | ORAL_TABLET | Freq: Four times a day (QID) | ORAL | Status: DC | PRN
  Filled 2014-10-27: qty 1

## 2014-10-27 MED ORDER — BUPIVACAINE HCL (PF) 0.25 % IJ SOLN
INTRAMUSCULAR | Status: AC
  Filled 2014-10-27: qty 30

## 2014-10-27 MED ORDER — CYCLOBENZAPRINE HCL 10 MG PO TABS
10.0000 mg | ORAL_TABLET | Freq: Two times a day (BID) | ORAL | Status: DC
  Administered 2014-10-27 – 2014-10-28 (×2): 10 mg via ORAL
  Filled 2014-10-27 (×4): qty 1

## 2014-10-27 MED ORDER — ONDANSETRON HCL 4 MG/2ML IJ SOLN: 4.0000 mg | Freq: Four times a day (QID) | INTRAMUSCULAR | Status: DC | PRN

## 2014-10-27 MED ORDER — EPHEDRINE SULFATE 50 MG/ML IJ SOLN
INTRAMUSCULAR | Status: AC
  Filled 2014-10-27: qty 2

## 2014-10-27 MED ORDER — ROTIGOTINE 2 MG/24HR TD PT24
2.0000 | MEDICATED_PATCH | Freq: Every day | TRANSDERMAL | Status: DC
  Administered 2014-10-28: 2 via TRANSDERMAL
  Filled 2014-10-27 (×2): qty 2

## 2014-10-27 MED ORDER — PHENYLEPHRINE HCL 10 MG/ML IJ SOLN
INTRAMUSCULAR | Status: DC | PRN
  Administered 2014-10-27: 80 ug via INTRAVENOUS
  Administered 2014-10-27 (×2): 40 ug via INTRAVENOUS

## 2014-10-27 MED ORDER — MORPHINE SULFATE 2 MG/ML IJ SOLN
1.0000 mg | INTRAMUSCULAR | Status: DC | PRN
  Administered 2014-10-27 – 2014-10-28 (×2): 1 mg via INTRAVENOUS
  Filled 2014-10-27 (×2): qty 1

## 2014-10-27 MED ORDER — EPHEDRINE SULFATE 50 MG/ML IJ SOLN
INTRAMUSCULAR | Status: DC | PRN
  Administered 2014-10-27 (×2): 10 mg via INTRAVENOUS
  Administered 2014-10-27: 5 mg via INTRAVENOUS
  Administered 2014-10-27 (×2): 10 mg via INTRAVENOUS

## 2014-10-27 MED ORDER — STERILE WATER FOR INJECTION IJ SOLN
INTRAMUSCULAR | Status: AC
  Filled 2014-10-27: qty 10

## 2014-10-27 MED ORDER — MIDAZOLAM HCL 2 MG/2ML IJ SOLN
INTRAMUSCULAR | Status: AC
  Administered 2014-10-27: 0.5 mg
  Filled 2014-10-27: qty 2

## 2014-10-27 MED ORDER — CARBIDOPA-LEVODOPA ER 50-200 MG PO TBCR
1.0000 | EXTENDED_RELEASE_TABLET | Freq: Three times a day (TID) | ORAL | Status: DC
  Administered 2014-10-27 – 2014-10-28 (×2): 1 via ORAL
  Filled 2014-10-27 (×5): qty 1

## 2014-10-27 MED ORDER — 0.9 % SODIUM CHLORIDE (POUR BTL) OPTIME
TOPICAL | Status: DC | PRN
  Administered 2014-10-27: 1000 mL

## 2014-10-27 MED ORDER — OXYCODONE HCL 5 MG PO TABS
5.0000 mg | ORAL_TABLET | ORAL | Status: DC | PRN
  Administered 2014-10-28: 10 mg via ORAL
  Filled 2014-10-27: qty 2

## 2014-10-27 MED ORDER — FENTANYL CITRATE 0.05 MG/ML IJ SOLN
INTRAMUSCULAR | Status: AC
  Administered 2014-10-27: 25 ug
  Filled 2014-10-27: qty 2

## 2014-10-27 MED ORDER — PHENYLEPHRINE 40 MCG/ML (10ML) SYRINGE FOR IV PUSH (FOR BLOOD PRESSURE SUPPORT)
PREFILLED_SYRINGE | INTRAVENOUS | Status: AC
  Filled 2014-10-27: qty 20

## 2014-10-27 MED ORDER — SENNA 8.6 MG PO TABS
1.0000 | ORAL_TABLET | Freq: Two times a day (BID) | ORAL | Status: DC
  Administered 2014-10-27 – 2014-10-28 (×2): 8.6 mg via ORAL
  Filled 2014-10-27 (×4): qty 1

## 2014-10-27 MED ORDER — CEFAZOLIN SODIUM 1-5 GM-% IV SOLN
1.0000 g | Freq: Four times a day (QID) | INTRAVENOUS | Status: AC
  Administered 2014-10-27 – 2014-10-28 (×3): 1 g via INTRAVENOUS
  Filled 2014-10-27 (×3): qty 50

## 2014-10-27 MED ORDER — METOCLOPRAMIDE HCL 5 MG/ML IJ SOLN: 5.0000 mg | Freq: Three times a day (TID) | INTRAMUSCULAR | Status: DC | PRN

## 2014-10-27 MED ORDER — ONDANSETRON HCL 4 MG/2ML IJ SOLN
INTRAMUSCULAR | Status: DC | PRN
  Administered 2014-10-27: 4 mg via INTRAVENOUS

## 2014-10-27 MED ORDER — ARTIFICIAL TEARS OP OINT
TOPICAL_OINTMENT | OPHTHALMIC | Status: DC | PRN
  Administered 2014-10-27: 1 via OPHTHALMIC

## 2014-10-27 MED ORDER — LORAZEPAM 0.5 MG PO TABS
0.5000 mg | ORAL_TABLET | Freq: Every day | ORAL | Status: DC
  Administered 2014-10-27: 0.5 mg via ORAL
  Filled 2014-10-27: qty 1

## 2014-10-27 MED ORDER — HYDROCODONE-ACETAMINOPHEN 5-325 MG PO TABS: 1.0000 | ORAL_TABLET | Freq: Four times a day (QID) | ORAL | Status: AC | PRN

## 2014-10-27 MED ORDER — POLYETHYLENE GLYCOL 3350 17 G PO PACK: 17.0000 g | PACK | Freq: Every day | ORAL | Status: DC | PRN

## 2014-10-27 MED ORDER — HYDROCODONE-ACETAMINOPHEN 5-325 MG PO TABS
1.0000 | ORAL_TABLET | ORAL | Status: DC | PRN
  Administered 2014-10-27 – 2014-10-28 (×2): 1 via ORAL
  Filled 2014-10-27 (×2): qty 1

## 2014-10-27 MED ORDER — DEXAMETHASONE SODIUM PHOSPHATE 4 MG/ML IJ SOLN
INTRAMUSCULAR | Status: AC
  Filled 2014-10-27: qty 1

## 2014-10-27 MED ORDER — SORBITOL 70 % SOLN: 30.0000 mL | Freq: Every day | Status: DC | PRN

## 2014-10-27 MED ORDER — MEMANTINE HCL ER 28 MG PO CP24
28.0000 mg | ORAL_CAPSULE | Freq: Every day | ORAL | Status: DC
  Administered 2014-10-27: 28 mg via ORAL
  Filled 2014-10-27 (×2): qty 28

## 2014-10-27 MED ORDER — OXYCODONE-ACETAMINOPHEN 5-325 MG PO TABS
2.0000 | ORAL_TABLET | Freq: Four times a day (QID) | ORAL | Status: DC | PRN
  Administered 2014-10-28: 2 via ORAL
  Filled 2014-10-27: qty 2

## 2014-10-27 MED ORDER — SUCCINYLCHOLINE CHLORIDE 20 MG/ML IJ SOLN
INTRAMUSCULAR | Status: DC | PRN
  Administered 2014-10-27: 80 mg via INTRAVENOUS

## 2014-10-27 MED ORDER — LIDOCAINE HCL (CARDIAC) 20 MG/ML IV SOLN
INTRAVENOUS | Status: AC
  Filled 2014-10-27: qty 5

## 2014-10-27 MED ORDER — DIPHENHYDRAMINE HCL 12.5 MG/5ML PO ELIX: 25.0000 mg | ORAL_SOLUTION | ORAL | Status: DC | PRN

## 2014-10-27 MED ORDER — FENTANYL CITRATE 0.05 MG/ML IJ SOLN: 25.0000 ug | INTRAMUSCULAR | Status: DC | PRN

## 2014-10-27 MED ORDER — FENTANYL CITRATE 0.05 MG/ML IJ SOLN
INTRAMUSCULAR | Status: AC
  Filled 2014-10-27: qty 5

## 2014-10-27 MED ORDER — LIDOCAINE HCL (CARDIAC) 20 MG/ML IV SOLN
INTRAVENOUS | Status: DC | PRN
  Administered 2014-10-27: 60 mg via INTRAVENOUS

## 2014-10-27 MED ORDER — LACTATED RINGERS IV SOLN
INTRAVENOUS | Status: DC
  Administered 2014-10-27 (×2): via INTRAVENOUS

## 2014-10-27 MED ORDER — MAGNESIUM CITRATE PO SOLN: 1.0000 | Freq: Once | ORAL | Status: AC | PRN

## 2014-10-27 MED ORDER — MEPERIDINE HCL 25 MG/ML IJ SOLN: 6.2500 mg | INTRAMUSCULAR | Status: DC | PRN

## 2014-10-27 MED ORDER — FENTANYL CITRATE 0.05 MG/ML IJ SOLN
INTRAMUSCULAR | Status: DC | PRN
  Administered 2014-10-27: 50 ug via INTRAVENOUS

## 2014-10-27 MED ORDER — PROPOFOL 10 MG/ML IV BOLUS
INTRAVENOUS | Status: AC
  Filled 2014-10-27: qty 20

## 2014-10-27 MED ORDER — ONDANSETRON HCL 4 MG/2ML IJ SOLN
INTRAMUSCULAR | Status: AC
  Filled 2014-10-27: qty 2

## 2014-10-27 MED ORDER — ONDANSETRON HCL 4 MG PO TABS
4.0000 mg | ORAL_TABLET | Freq: Four times a day (QID) | ORAL | Status: DC | PRN
  Administered 2014-10-28: 4 mg via ORAL
  Filled 2014-10-27: qty 1

## 2014-10-27 MED ORDER — SODIUM CHLORIDE 0.9 % IV SOLN
INTRAVENOUS | Status: DC
  Administered 2014-10-27 – 2014-10-28 (×2): via INTRAVENOUS

## 2014-10-27 MED ORDER — ROTIGOTINE 4 MG/24HR TD PT24
1.0000 | MEDICATED_PATCH | Freq: Every day | TRANSDERMAL | Status: DC
  Filled 2014-10-27: qty 1

## 2014-10-27 MED ORDER — ROCURONIUM BROMIDE 50 MG/5ML IV SOLN
INTRAVENOUS | Status: AC
  Filled 2014-10-27: qty 1

## 2014-10-27 MED ORDER — RIVASTIGMINE 4.6 MG/24HR TD PT24
4.6000 mg | MEDICATED_PATCH | Freq: Every day | TRANSDERMAL | Status: DC
  Administered 2014-10-28: 4.6 mg via TRANSDERMAL
  Filled 2014-10-27 (×2): qty 1

## 2014-10-27 MED ORDER — METOCLOPRAMIDE HCL 10 MG PO TABS
5.0000 mg | ORAL_TABLET | Freq: Three times a day (TID) | ORAL | Status: DC | PRN
Start: 2014-10-27 — End: 2014-10-28

## 2014-10-27 MED ORDER — DEXAMETHASONE SODIUM PHOSPHATE 4 MG/ML IJ SOLN
INTRAMUSCULAR | Status: DC | PRN
  Administered 2014-10-27: 4 mg via INTRAVENOUS

## 2014-10-27 MED ORDER — BUPIVACAINE-EPINEPHRINE (PF) 0.5% -1:200000 IJ SOLN
INTRAMUSCULAR | Status: DC | PRN
  Administered 2014-10-27: 22 mL via PERINEURAL

## 2014-10-27 MED ORDER — PROMETHAZINE HCL 25 MG/ML IJ SOLN: 6.2500 mg | INTRAMUSCULAR | Status: DC | PRN

## 2014-10-27 MED ORDER — SUCCINYLCHOLINE CHLORIDE 20 MG/ML IJ SOLN
INTRAMUSCULAR | Status: AC
  Filled 2014-10-27: qty 2

## 2014-10-27 MED ORDER — CEFAZOLIN SODIUM-DEXTROSE 2-3 GM-% IV SOLR
2.0000 g | INTRAVENOUS | Status: AC
  Administered 2014-10-27: 2 g via INTRAVENOUS
  Filled 2014-10-27: qty 50

## 2014-10-27 SURGICAL SUPPLY — 67 items
BENZOIN TINCTURE PRP APPL 2/3 (GAUZE/BANDAGES/DRESSINGS) ×3 IMPLANT
BIT DRILL 4 LONG FAST STEP (BIT) ×3 IMPLANT
BIT DRILL 4 SHORT FAST STEP (BIT) ×3 IMPLANT
BLADE SURG 10 STRL SS (BLADE) IMPLANT
BNDG ESMARK 4X9 LF (GAUZE/BANDAGES/DRESSINGS) IMPLANT
CLOSURE STERI-STRIP 1/2X4 (GAUZE/BANDAGES/DRESSINGS) ×1
CLSR STERI-STRIP ANTIMIC 1/2X4 (GAUZE/BANDAGES/DRESSINGS) ×2 IMPLANT
COVER SURGICAL LIGHT HANDLE (MISCELLANEOUS) ×3 IMPLANT
CUFF TOURNIQUET SINGLE 18IN (TOURNIQUET CUFF) ×3 IMPLANT
CUFF TOURNIQUET SINGLE 24IN (TOURNIQUET CUFF) IMPLANT
DERMABOND ADHESIVE PROPEN (GAUZE/BANDAGES/DRESSINGS) ×2
DERMABOND ADVANCED .7 DNX6 (GAUZE/BANDAGES/DRESSINGS) ×1 IMPLANT
DRAPE C-ARM 42X72 X-RAY (DRAPES) ×3 IMPLANT
DRAPE IMP U-DRAPE 54X76 (DRAPES) ×3 IMPLANT
DRAPE INCISE IOBAN 66X45 STRL (DRAPES) ×3 IMPLANT
DRAPE U-SHAPE 47X51 STRL (DRAPES) ×3 IMPLANT
DRILL BIT 2.8MM DB28 (MISCELLANEOUS) ×3 IMPLANT
DRSG MEPILEX BORDER 4X8 (GAUZE/BANDAGES/DRESSINGS) ×3 IMPLANT
DRSG TEGADERM 4X4.75 (GAUZE/BANDAGES/DRESSINGS) ×3 IMPLANT
ELECT CAUTERY BLADE 6.4 (BLADE) ×3 IMPLANT
ELECT REM PT RETURN 9FT ADLT (ELECTROSURGICAL) ×3
ELECTRODE REM PT RTRN 9FT ADLT (ELECTROSURGICAL) ×1 IMPLANT
FACESHIELD WRAPAROUND (MASK) ×3 IMPLANT
GAUZE SPONGE 4X4 12PLY STRL (GAUZE/BANDAGES/DRESSINGS) ×3 IMPLANT
GAUZE XEROFORM 1X8 LF (GAUZE/BANDAGES/DRESSINGS) ×3 IMPLANT
GAUZE XEROFORM 5X9 LF (GAUZE/BANDAGES/DRESSINGS) ×3 IMPLANT
GLOVE SURG SYN 7.5  E (GLOVE) ×4
GLOVE SURG SYN 7.5 E (GLOVE) ×2 IMPLANT
GOWN STRL REIN XL XLG (GOWN DISPOSABLE) ×9 IMPLANT
KIT BASIN OR (CUSTOM PROCEDURE TRAY) ×3 IMPLANT
KIT ROOM TURNOVER OR (KITS) ×3 IMPLANT
MANIFOLD NEPTUNE II (INSTRUMENTS) ×3 IMPLANT
NS IRRIG 1000ML POUR BTL (IV SOLUTION) ×3 IMPLANT
PACK SHOULDER (CUSTOM PROCEDURE TRAY) ×3 IMPLANT
PACK UNIVERSAL I (CUSTOM PROCEDURE TRAY) ×3 IMPLANT
PAD ARMBOARD 7.5X6 YLW CONV (MISCELLANEOUS) ×6 IMPLANT
PAD CAST 4YDX4 CTTN HI CHSV (CAST SUPPLIES) ×1 IMPLANT
PADDING CAST COTTON 4X4 STRL (CAST SUPPLIES) ×2
PEG STND 4.0X30MM (Orthopedic Implant) ×9 IMPLANT
PEG STND 4.0X37.5MM (Orthopedic Implant) ×3 IMPLANT
PEG STND 4.0X40MM (Orthopedic Implant) ×3 IMPLANT
PEG STND 4.0X47.5MM (Orthopedic Implant) ×3 IMPLANT
PEGSTD 4.0X30MM (Orthopedic Implant) ×3 IMPLANT
PEGSTD 4.0X37.5MM (Orthopedic Implant) ×1 IMPLANT
PEGSTD 4.0X40MM (Orthopedic Implant) ×1 IMPLANT
PEGSTD 4.0X47.5MM (Orthopedic Implant) ×1 IMPLANT
PLATE SHOULDER S3 3HOLE LT (Plate) ×3 IMPLANT
SCREW LOCK 90D ANGLED 3.8X22 (Screw) ×6 IMPLANT
SCREW LOCK 90D ANGLED 3.8X28 (Screw) ×3 IMPLANT
SCREW LOCK 90D ANGLED 3.8X32 (Screw) ×3 IMPLANT
SCREW MULTIDIR 3.8X32 HUMRL (Screw) ×3 IMPLANT
SLING ARM IMMOBILIZER LRG (SOFTGOODS) ×3 IMPLANT
SPONGE LAP 18X18 X RAY DECT (DISPOSABLE) ×3 IMPLANT
STAPLER VISISTAT 35W (STAPLE) IMPLANT
SUCTION FRAZIER TIP 10 FR DISP (SUCTIONS) IMPLANT
SUT ETHILON 3 0 PS 1 (SUTURE) ×6 IMPLANT
SUT MNCRL 3 0 RB1 (SUTURE) ×1 IMPLANT
SUT MONOCRYL 3 0 RB1 (SUTURE) ×2
SUT VIC AB 0 CT1 27 (SUTURE) ×4
SUT VIC AB 0 CT1 27XBRD ANBCTR (SUTURE) ×2 IMPLANT
SUT VIC AB 2-0 CT1 27 (SUTURE) ×4
SUT VIC AB 2-0 CT1 TAPERPNT 27 (SUTURE) ×2 IMPLANT
SYR CONTROL 10ML LL (SYRINGE) IMPLANT
TOWEL OR 17X24 6PK STRL BLUE (TOWEL DISPOSABLE) IMPLANT
TOWEL OR 17X26 10 PK STRL BLUE (TOWEL DISPOSABLE) ×3 IMPLANT
UNDERPAD 30X30 INCONTINENT (UNDERPADS AND DIAPERS) ×3 IMPLANT
WATER STERILE IRR 1000ML POUR (IV SOLUTION) ×3 IMPLANT

## 2014-10-27 NOTE — Anesthesia Preprocedure Evaluation (Addendum)
Anesthesia Evaluation  Patient identified by MRN, date of birth, ID band Patient awake    Reviewed: Allergy & Precautions, H&P , NPO status , Patient's Chart, lab work & pertinent test results  Airway Mallampati: II       Dental  (+) Teeth Intact   Pulmonary  breath sounds clear to auscultation        Cardiovascular Rhythm:Regular     Neuro/Psych Anxiety Parkinsons  Neuromuscular disease    GI/Hepatic   Endo/Other    Renal/GU      Musculoskeletal   Abdominal (+)  Abdomen: soft.    Peds  Hematology   Anesthesia Other Findings   Reproductive/Obstetrics                            Anesthesia Physical Anesthesia Plan  ASA: II  Anesthesia Plan: General   Post-op Pain Management:    Induction: Intravenous  Airway Management Planned: Oral ETT  Additional Equipment:   Intra-op Plan:   Post-operative Plan: Extubation in OR  Informed Consent: I have reviewed the patients History and Physical, chart, labs and discussed the procedure including the risks, benefits and alternatives for the proposed anesthesia with the patient or authorized representative who has indicated his/her understanding and acceptance.     Plan Discussed with:   Anesthesia Plan Comments: (Get EKG if she does not have one)        Anesthesia Quick Evaluation

## 2014-10-27 NOTE — Progress Notes (Signed)
Occupational Therapy Evaluation Patient Details Name: Heather Mora MRN: [id number] DOB: 04-28-1930 Today's Date: 10/27/2014    History of Present Illness Pt is an 80 y/o female admitted s/p fall at home sustaining a proximal humerus fracture. Pt is now s/p ORIF L proximal humerus and is NWB. PMH signficant for Parkinson's Disease and mild dementia.    Clinical Impression   PTA pt lived at home with her daughter and was independent with ADLs. Pt currently requires mod-max (A) for ADLs due to LUE deficits in addition to balance deficits. Pt presents with shuffling Parkinsonian-like gait. Pt's daughter will be primary caregiver for pt. Pt will benefit from acute OT to address LUE ROM and compensatory techniques for ADLs as well as HHOT to further promote independence.     Follow Up Recommendations  Home health OT;Supervision/Assistance - 24 hour    Equipment Recommendations  3 in 1 bedside comode    Recommendations for Other Services       Precautions / Restrictions Precautions Precautions: Fall;Shoulder Shoulder Interventions: Shoulder sling/immobilizer Precaution Comments: Per MD, pt can wear sling during mobility and take off when resting (pillows under LUE).  Required Braces or Orthoses: Sling Restrictions Weight Bearing Restrictions: Yes LUE Weight Bearing: Non weight bearing      Mobility Bed Mobility Overal bed mobility: Needs Assistance Bed Mobility: Sit to Supine     Supine to sit: Min assist Sit to supine: Mod assist   General bed mobility comments: Mod A to lower trunk to bed safely and manage LEs onto bed. VC's not to use LUE during mobility.   Transfers Overall transfer level: Needs assistance Equipment used: 1 person hand held assist Transfers: Sit to/from [employer] CorporationStand;Stand Pivot Transfers Sit to Stand: Min guard Stand pivot transfers: Min assist       General transfer comment: Pt able to stand from recliner with close min guard for safety. Pt took  pivotal steps back to bed with hand held assist for balance. Pt with shuffling Parkinsonian gait pattern.     Balance Overall balance assessment: Needs assistance;History of Falls Sitting-balance support: Feet supported;No upper extremity supported Sitting balance-Leahy Scale: Poor Sitting balance - Comments: Pt requires UE support.    Standing balance support: Single extremity supported;During functional activity Standing balance-Leahy Scale: Poor Standing balance comment: Pt requires assist during dynamic standing activity.                             ADL Overall ADL's : Needs assistance/impaired Eating/Feeding: Set up;Sitting Eating/Feeding Details (indicate cue type and reason): (A) to open containers and cut food Grooming: Set up;Sitting   Upper Body Bathing: Moderate assistance;Sitting   Lower Body Bathing: Sit to/from stand;Moderate assistance   Upper Body Dressing : Maximal assistance;Sitting Upper Body Dressing Details (indicate cue type and reason): including sling Lower Body Dressing: Sit to/from stand;Maximal assistance   Toilet Transfer: Minimal assistance;Stand-pivot (hand held assist from recliner>bed)           Functional mobility during ADLs: Minimal assistance (hand held assist) General ADL Comments: Pt's daughter in room and will be primary caregiver. Pt is limited by LUE deficit as well as short term memory. Pt with shuffling steps during stand-pivot transfer     Vision  Pt reports no change from baseline.                    Perception Perception Perception Tested?: No   Praxis Praxis Praxis tested?: Within functional  limits    Pertinent Vitals/Pain Pain Assessment: No/denies pain ("numb")     Hand Dominance Right   Extremity/Trunk Assessment Upper Extremity Assessment Upper Extremity Assessment: LUE deficits/detail LUE Deficits / Details: Decreased strength and AROM consistent with ORIF proximal humerus. Pt reports  lingering numbness from nerve block.  LUE: Unable to fully assess due to immobilization LUE Sensation: decreased light touch (due to nerve block)   Lower Extremity Assessment Lower Extremity Assessment: Generalized weakness   Cervical / Trunk Assessment Cervical / Trunk Assessment: Normal   Communication Communication Communication: No difficulties   Cognition Arousal/Alertness: Awake/alert Behavior During Therapy: WFL for tasks assessed/performed Overall Cognitive Status: Within Functional Limits for tasks assessed                                Home Living Family/patient expects to be discharged to:: Private residence Living Arrangements: Children Available Help at Discharge: Family;Available 24 hours/day Type of Home: House Home Access: Stairs to enter Enter[employer] CorporationEntrance Stairs-Number of Steps: 3 Entrance Stairs-Rails: None Home Layout: Two level;Able to live on main level with bedroom/bathroom         Bathroom Toilet: Standard     Home Equipment: None          Prior Functioning/Environment Level of Independence: Independent             OT Diagnosis: Generalized weakness;Cognitive deficits;Acute pain   OT Problem List: Decreased strength;Decreased range of motion;Decreased activity tolerance;Impaired balance (sitting and/or standing);Decreased cognition;Decreased safety awareness;Decreased knowledge of use of DME or AE;Decreased knowledge of precautions;Impaired UE functional use;Pain   OT Treatment/Interventions: Self-care/ADL training;Therapeutic exercise;Energy conservation;DME and/or AE instruction;Therapeutic activities;Patient/family education;Balance training    OT Goals(Current goals can be found in the care plan section) Acute Rehab OT Goals Patient Stated Goal: Pt did not state. Pt's daughter would like pt to be safe at home OT Goal Formulation: With patient/family Time For Goal Achievement: 11/10/14 Potential to Achieve Goals: Good ADL  Goals Pt Will Perform Upper Body Bathing: with min assist;sitting Pt Will Perform Upper Body Dressing: with mod assist;sitting Pt Will Transfer to Toilet: with supervision;stand pivot transfer;bedside commode Pt Will Perform Toileting - Clothing Manipulation and hygiene: with supervision;sit to/from stand Pt/caregiver will Perform Home Exercise Program: Increased ROM;Right Upper extremity;Independently  OT Frequency: Min 2X/week    End of Session Equipment Utilized During Treatment: Other (comment);Oxygen (sling)  Activity Tolerance: Patient tolerated treatment well Patient left: in bed;with call bell/phone within reach;with family/visitor present   Time: [id number] OT Time Calculation (min): 18 min Charges:  OT General Charges $OT Visit: 1 Procedure OT Evaluation $Initial OT Evaluation Tier I: 1 Procedure OT Treatments $Self Care/Home Management : 8-22 mins G-Codes: OT G-codes **NOT FOR INPATIENT CLASS** Functional Assessment Tool Used: clinical judgement Functional Limitation: Self care Self Care Current Status (Q6578(G8987): At least 20 percent but less than 40 percent impaired, limited or restricted Self Care Goal Status (I6962(G8988): At least 1 percent but less than 20 percent impaired, limited or restricted  Rae LipsMiller, Lendon George M 10/27/2014, 6:37 PM   Carney LivingLeeAnn Marie Kobie Whidby, OTR/L Occupational Therapist [phone] (pager)

## 2014-10-27 NOTE — Evaluation (Signed)
Physical Therapy Evaluation Patient Details Name: Heather Mora MRN: [id number] DOB: 13-Aug-1930 Today's Date: 10/27/2014   History of Present Illness  Pt is an 80 y/o female admitted s/p fall at home sustaining a proximal humerus fracture. Pt is now s/p ORIF L proximal humerus and is NWB. PMH signficant for Parkinson's Disease and mild dementia.   Clinical Impression  This patient presents with acute pain and decreased functional independence following the above mentioned procedure. At the time of PT eval, pt was able to perform transfers with min assist. Pt will require continued gait training prior to d/c - try cane next session, however unsure if cane will be beneficial due to Parkinsonian gait pattern. Daughter was present for education during SPT's, and any further education regarding safety with ambulation/transfers will be helpful for her. Recommended use of gait belt at home, especially when entering home via steps. This patient is appropriate for skilled PT interventions to address functional limitations, improve safety and independence with functional mobility, and return to PLOF.      Follow Up Recommendations Home health PT;Supervision/Assistance - 24 hour    Equipment Recommendations  3in1 (PT);Cane;Other (comment) (Tub bench/shower seat - per OT recommendations)    Recommendations for Other Services       Precautions / Restrictions Precautions Precautions: Fall;Shoulder Shoulder Interventions: Shoulder sling/immobilizer Precaution Comments: Per MD who was present during a portion of session, pt can wear sling during mobility and take off when resting (pillows under LUE).  Restrictions Weight Bearing Restrictions: Yes LUE Weight Bearing: Non weight bearing      Mobility  Bed Mobility Overal bed mobility: Needs Assistance Bed Mobility: Supine to Sit     Supine to sit: Min assist     General bed mobility comments: Minimal physical assist required, however pt  moves very slowly and requires specific cueing for sequencing and technique. Use of bed rails required and cueing to avoid leaning on the LUE during mobility.   Transfers Overall transfer level: Needs assistance Equipment used: 1 person hand held assist Transfers: Sit to/from [employer] CorporationStand;Stand Pivot Transfers Sit to Stand: Min guard Stand pivot transfers: Min assist       General transfer comment: Pt able to power-up to full standing position without assistance, however required steadying assist for SPT bed>BSC>recliner. Parkinsonian gait pattern noted.   Ambulation/Gait             General Gait Details: Deferred due to pt fatigue.   Stairs            Wheelchair Mobility    Modified Rankin (Stroke Patients Only)       Balance Overall balance assessment: Needs assistance;History of Falls Sitting-balance support: Feet supported;No upper extremity supported Sitting balance-Leahy Scale: Poor Sitting balance - Comments: Pt requires UE support.    Standing balance support: Single extremity supported;During functional activity Standing balance-Leahy Scale: Poor Standing balance comment: Pt requires assist during dynamic standing activity.                              Pertinent Vitals/Pain Pain Assessment: No/denies pain    Home Living Family/patient expects to be discharged to:: Private residence Living Arrangements: Children Available Help at Discharge: Family;Available 24 hours/day Type of Home: House Home Access: Stairs to enter Entrance Stairs-Rails: None Entrance Stairs-Number of Steps: 3 Home Layout: Two level;Able to live on main level with bedroom/bathroom Home Equipment: None      Prior Function Level of Independence: Independent  Hand Dominance   Dominant Hand: Right    Extremity/Trunk Assessment   Upper Extremity Assessment: LUE deficits/detail       LUE Deficits / Details: Decreased strength and AROM consistent  with ORIF proximal humerus. Pt reports lingering numbness from nerve block.    Lower Extremity Assessment: Generalized weakness      Cervical / Trunk Assessment: Normal  Communication   Communication: No difficulties  Cognition Arousal/Alertness: Awake/alert Behavior During Therapy: WFL for tasks assessed/performed Overall Cognitive Status: Within Functional Limits for tasks assessed                      General Comments      Exercises        Assessment/Plan    PT Assessment Patient needs continued PT services  PT Diagnosis Abnormality of gait;Acute pain   PT Problem List Decreased strength;Decreased range of motion;Decreased activity tolerance;Decreased balance;Decreased mobility;Decreased knowledge of use of DME;Decreased safety awareness;Decreased knowledge of precautions;Pain  PT Treatment Interventions Gait training;DME instruction;Stair training;Functional mobility training;Therapeutic activities;Therapeutic exercise;Neuromuscular re-education;Patient/family education   PT Goals (Current goals can be found in the Care Plan section) Acute Rehab PT Goals Patient Stated Goal: Pt did not state goals during session. Pt's daughter's goal is for pt to be safe mobilizing around the home PT Goal Formulation: With patient/family Time For Goal Achievement: 11/03/14 Potential to Achieve Goals: Good    Frequency Min 5X/week   Barriers to discharge        Co-evaluation               End of Session Equipment Utilized During Treatment: Gait belt;Oxygen;Other (comment) (Sling) Activity Tolerance: Patient limited by fatigue Patient left: in chair;with call bell/phone within reach;with family/visitor present Nurse Communication: Mobility status    Functional Assessment Tool Used: Clinical judgement Functional Limitation: Mobility: Walking and moving around Mobility: Walking and Moving Around Current Status (Z6109(G8978): At least 40 percent but less than 60 percent  impaired, limited or restricted Mobility: Walking and Moving Around Goal Status [id number](G8979): At least 20 percent but less than 40 percent impaired, limited or restricted    Time: 1612-1655 PT Time Calculation (min) (ACUTE ONLY): 43 min   Charges:   PT Evaluation $Initial PT Evaluation Tier I: 1 Procedure PT Treatments $Gait Training: 8-22 mins $Therapeutic Activity: 23-37 mins   PT G Codes:   Functional Assessment Tool Used: Clinical judgement Functional Limitation: Mobility: Walking and moving around    Conni SlipperKirkman, Jaree Trinka 10/27/2014, 5:11 PM   Conni SlipperLaura Neya Creegan, PT, DPT Acute Rehabilitation Services Pager: [phone]

## 2014-10-27 NOTE — Anesthesia Postprocedure Evaluation (Signed)
  Anesthesia Post-op Note  Patient: Heather Mora  Procedure(s) Performed: Procedure(s): OPEN REDUCTION INTERNAL FIXATION (ORIF) LEFT PROXIMAL HUMERUS FRACTURE (Left)  Patient Location: PACU  Anesthesia Type:General  Level of Consciousness: awake, alert  and oriented  Airway and Oxygen Therapy: Patient Spontanous Breathing and Patient connected to nasal cannula oxygen  Post-op Pain: mild  Post-op Assessment: Post-op Vital signs reviewed, Patient's Cardiovascular Status Stable, Respiratory Function Stable, Patent Airway and No signs of Nausea or vomiting  Post-op Vital Signs: Reviewed and stable  Last Vitals:  Filed Vitals:   10/27/14 1400  BP:   Temp: 36.4 C  Resp:     Complications: No apparent anesthesia complications

## 2014-10-27 NOTE — Anesthesia Procedure Notes (Addendum)
Anesthesia Regional Block:  Interscalene brachial plexus block  Pre-Anesthetic Checklist: ,, timeout performed, Correct Patient, Correct Site, Correct Laterality, Correct Procedure, Correct Position, site marked, Risks and benefits discussed,  Surgical consent,  Pre-op evaluation,  At surgeon's request and post-op pain management  Laterality: Left  Prep: Maximum Sterile Barrier Precautions used and chloraprep       Needles:   Needle Type: Echogenic Stimulator Needle     Needle Length: 5cm 5 cm Needle Gauge: 22 and 22 G    Additional Needles:  Procedures: ultrasound guided (picture in chart) and nerve stimulator Interscalene brachial plexus block  Nerve Stimulator or Paresthesia:  Response: 0.5 mA,   Additional Responses:   Narrative:  Start time: 10/27/2014 10:50 AM End time: 10/27/2014 11:00 AM  Additional Notes: L interscalene block, US, Stimulator, neuropin with 4mg  decadron, 22cc, sterile, talked throughout procedure, no complications   Procedure Name: Intubation Date/Time: 10/27/2014 12:04 PM Performed by: Sarita HaverFLOWERS, Shellye Zandi T Pre-anesthesia Checklist: Patient identified, Emergency Drugs available, Suction available, Patient being monitored and Timeout performed Patient Re-evaluated:Patient Re-evaluated prior to inductionOxygen Delivery Method: Circle system utilized and Simple face mask Preoxygenation: Pre-oxygenation with 100% oxygen Intubation Type: IV induction Ventilation: Mask ventilation without difficulty Laryngoscope Size: Miller and 2 Grade View: Grade I Tube type: Oral Tube size: 7.0 mm Number of attempts: 1 Airway Equipment and Method: Patient positioned with wedge pillow and Stylet Placement Confirmation: ETT inserted through vocal cords under direct vision,  positive ETCO2 and breath sounds checked- equal and bilateral Secured at: 22 cm Tube secured with: Tape Dental Injury: Teeth and Oropharynx as per pre-operative assessment

## 2014-10-27 NOTE — Transfer of Care (Signed)
Immediate Anesthesia Transfer of Care Note  Patient: Heather Mora  Procedure(s) Performed: Procedure(s): OPEN REDUCTION INTERNAL FIXATION (ORIF) LEFT PROXIMAL HUMERUS FRACTURE (Left)  Patient Location: PACU  Anesthesia Type:General and Regional  Level of Consciousness: awake  Airway & Oxygen Therapy: Patient connected to nasal cannula oxygen  Post-op Assessment: Report given to PACU RN, Post -op Vital signs reviewed and stable and Patient moving all extremities  Post vital signs: Reviewed and stable  Complications: No apparent anesthesia complications

## 2014-10-27 NOTE — Op Note (Signed)
   Date of Surgery: 10/27/2014  INDICATIONS: [name] is a 80 y.o.-year-old female who sustained a left proximal humerus fracture indicated for surgery ;  The patient and family did consent to the procedure after discussion of the risks and benefits.  PREOPERATIVE DIAGNOSIS: Left surgical neck proximal humerus fracture  POSTOPERATIVE DIAGNOSIS: Same.  PROCEDURE: Open treatment of left proximal humerus fracture  SURGEON: N. Glee ArvinMichael Xu, M.D.  ASSIST: none.  ANESTHESIA:  general  IV FLUIDS AND URINE: See anesthesia.  ESTIMATED BLOOD LOSS: 150 mL.  IMPLANTS: Biomet S3 proximal humerus plate  DRAINS: none  COMPLICATIONS: None.  DESCRIPTION OF PROCEDURE: The patient was brought to the operating room and placed supine on the operating table.  The patient had been signed prior to the procedure and this was documented. The patient had the anesthesia placed by the anesthesiologist.  A time-out was performed to confirm that this was the correct patient, site, side and location. The patient had an SCD on bilateral lower extremity. The patient did receive antibiotics prior to the incision and was re-dosed during the procedure as needed at indicated intervals.  The patient had the operative extremity prepped and draped in the standard surgical fashion.    An anterolateral acromial approach to the proximal humerus was used. Incision from the anterior lateral corner of the acromion carried down the shaft of the humerus for approximately 15 cm. Blunt dissection was taken down to the deltoid muscle and raphae. Full-thickness flaps were elevated both anteriorly and posteriorly. The deltoid muscle was split in line with its fibers at the raphae. The axillary nerve was identified approximately 6-1/2 cm distal to the lateral edge of the acromion. This was tagged with a vessel loop and protected during the surgery. The bursa was then excised. The fracture line was exposed. The fracture was reduced using  reduction and manual maneuvers. The reduction was confirmed under x-ray. With the fracture reduced we placed a proximal humerus locking plate to the lateral aspect of the proximal humerus. Once this was placed at the appropriate height a provisional K wire was placed through the central K wire hole to hold the plate in place. I then placed a nonlocking working screw through the oblong hole of the plate through the shaft of the humerus. This brought the humeral shaft onto the plate. With the fracture still reduced I then placed locking pegs through the plate and into the humeral head. Hand drilling was used in order to not penetrate the articular surface. Pegs were placed sequentially and under fluoroscopy to confirm its proper placement. We then placed 2 locking screws in the shaft given the osteoporotic nature of the bone. Final x-rays were then taken to confirm proper reduction and hardware placement. The wound was thoroughly irrigated and closed in layer fashion using 2-0 Vicryl plus for the deep skin layer and a running 4-0  Monocryl for the skin. Sterile dressings were applied. Patient was extubated and transferred to the PACU in stable condition.  POSTOPERATIVE PLAN: patient will be admitted overnight for pain control and mobilization with therapy in the morning. She will be nonweightbearing to the left upper extremity for approximately 6 weeks. We will initiate gentle range of motion with therapy in approximately 2 weeks.  [name]. Michael Xu, MD [facility] [phone] 2:04 PM

## 2014-10-28 ENCOUNTER — Encounter (HOSPITAL_COMMUNITY): Payer: Self-pay | Admitting: Orthopaedic Surgery

## 2014-10-28 DIAGNOSIS — S42212A Unspecified displaced fracture of surgical neck of left humerus, initial encounter for closed fracture: Secondary | ICD-10-CM | POA: Diagnosis not present

## 2014-10-28 LAB — COMPREHENSIVE METABOLIC PANEL
ANION GAP: 11 (ref 5–15)
AST: 18 U/L (ref 0–37)
Albumin: 2.7 g/dL — ABNORMAL LOW (ref 3.5–5.2)
Alkaline Phosphatase: 98 U/L (ref 39–117)
BUN: 16 mg/dL (ref 6–23)
CALCIUM: 8.6 mg/dL (ref 8.4–10.5)
CO2: 23 meq/L (ref 19–32)
Chloride: 98 mEq/L (ref 96–112)
Creatinine, Ser: 0.49 mg/dL — ABNORMAL LOW (ref 0.50–1.10)
GFR calc Af Amer: >90 mL/min (ref >90)
GFR, EST NON AFRICAN AMERICAN: 89 mL/min — AB (ref >90)
GLUCOSE: 120 mg/dL — AB (ref 70–99)
Potassium: 4.1 mEq/L (ref 3.7–5.3)
Sodium: 132 mEq/L — ABNORMAL LOW (ref 137–147)
TOTAL PROTEIN: 5.1 g/dL — AB (ref 6.0–8.3)
Total Bilirubin: 0.3 mg/dL (ref 0.3–1.2)

## 2014-10-28 MED ORDER — KETOROLAC TROMETHAMINE 15 MG/ML IJ SOLN: 15.0000 mg | Freq: Four times a day (QID) | INTRAMUSCULAR | Status: DC | PRN

## 2014-10-28 NOTE — Progress Notes (Signed)
Physical Therapy Treatment Patient Details Name: Heather MalloryBernadette Jake MRN: [id number] DOB: 07/02/1930 Today's Date: 10/28/2014    History of Present Illness Pt is an 80 y/o female admitted s/p fall at home sustaining a proximal humerus fracture. Pt is now s/p ORIF L proximal humerus and is NWB. PMH signficant for Parkinson's Disease and mild dementia.     PT Comments    Pt seen to increase mobility and assess with cane. Pt unable to safely ambulate with cane at this time; cane seemed to serve as more of deterrent than an aide. Pt is a fall risk and requires min guard at this time for all mobility. Educated family on use of gt belt and guarding technique. Family and pt feels as though they are ready for D/C home.   Follow Up Recommendations  Home health PT;Supervision/Assistance - 24 hour     Equipment Recommendations  3in1 (PT)    Recommendations for Other Services       Precautions / Restrictions Precautions Precautions: Fall;Shoulder Shoulder Interventions: Shoulder sling/immobilizer Precaution Booklet Issued: Yes (comment) Precaution Comments: Per MD, pt can wear sling during mobility and take off when resting (pillows under LUE).  Required Braces or Orthoses: Sling Restrictions Weight Bearing Restrictions: Yes LUE Weight Bearing: Non weight bearing    Mobility  Bed Mobility               General bed mobility comments: pt up in chair and returned to chair  Transfers Overall transfer level: Needs assistance Equipment used: 1 person hand held assist;Straight cane Transfers: Sit to/from Stand Sit to Stand: Min guard         General transfer comment: cues for hand placement and technique; min guard to steady  Ambulation/Gait Ambulation/Gait assistance: Min guard Ambulation Distance (Feet): 100 Feet (50' x 2 ) Assistive device: 1 person hand held assist;Straight cane Gait Pattern/deviations: Shuffle;Decreased stride length;Ataxic Gait velocity: festinating gt  speed   General Gait Details: attempted ambulating with cane to assess need for AD; pt appeared comfortable with cane and cane appeared more distraction vs helpful; cues for safety and min guard to steady; educated family on guarding technique   Stairs Stairs: Yes Stairs assistance: Min assist Stair Management: No rails;Step to pattern;Forwards Number of Stairs: 3 General stair comments: educated pt and family on guarding technique for safe negotiation of steps ; daughter practiced negotiating steps with pt   Wheelchair Mobility    Modified Rankin (Stroke Patients Only)       Balance Overall balance assessment: Needs assistance;History of Falls Sitting-balance support: Feet supported;No upper extremity supported Sitting balance-Leahy Scale: Fair     Standing balance support: During functional activity;Single extremity supported Standing balance-Leahy Scale: Poor Standing balance comment: UE support to balance                    Cognition Arousal/Alertness: Awake/alert Behavior During Therapy: WFL for tasks assessed/performed Overall Cognitive Status: Within Functional Limits for tasks assessed                      Exercises      General Comments General comments (skin integrity, edema, etc.): educated pt and family on removing throw rugs; increased lightening for bathroom transfers and BSC beside bed to reduce risk of falls at home      Pertinent Vitals/Pain Pain Assessment: No/denies pain Pain Score: 0-No pain Pain Location: LUE Pain Intervention(s): Limited activity within patient's tolerance;Monitored during session;Repositioned;Patient requesting pain meds-RN notified  Home Living                      Prior Function            PT Goals (current goals can now be found in the care plan section) Acute Rehab PT Goals Patient Stated Goal: to go home today PT Goal Formulation: With patient/family Time For Goal Achievement:  11/03/14 Potential to Achieve Goals: Good Progress towards PT goals: Progressing toward goals    Frequency  Min 5X/week    PT Plan Current plan remains appropriate    Co-evaluation             End of Session Equipment Utilized During Treatment: Gait belt;Other (comment) (sling) Activity Tolerance: Patient tolerated treatment well Patient left: in chair;with call bell/phone within reach;with family/visitor present     Time: [id number] PT Time Calculation (min) (ACUTE ONLY): 24 min  Charges:  $Gait Training: 23-37 mins                    G Codes:  Functional Assessment Tool Used: Clinical judgement Functional Limitation: Mobility: Walking and moving around Mobility: Walking and Moving Around Current Status [phone](G8978): At least 1 percent but less than 20 percent impaired, limited or restricted Mobility: Walking and Moving Around Goal Status [phone](G8979): At least 1 percent but less than 20 percent impaired, limited or restricted Mobility: Walking and Moving Around Discharge Status [phone](G8980): At least 1 percent but less than 20 percent impaired, limited or restricted   Donell SievertWest, Majour Frei N, [place] CarolinaPT  [phone] 10/28/2014, 2:31 PM

## 2014-10-28 NOTE — Discharge Instructions (Signed)
1. Keep incision clean and dry 2. May shower after 10 days from surgery.  Dab incision dry when wet. 3. Take off surgical dressing on Saturday or Sunday.  Place gauze on incision with a small amount of paper tape

## 2014-10-28 NOTE — Plan of Care (Signed)
Problem: Phase I Progression Outcomes Goal: Pain controlled with appropriate interventions Outcome: Progressing Goal: OOB as tolerated unless otherwise ordered Outcome: Progressing Goal: Clear liquids, advance diet as tolerated Outcome: Completed/Met Date Met:  10/28/14

## 2014-10-28 NOTE — Care Management Note (Signed)
CARE MANAGEMENT NOTE 10/28/2014  Patient:  Heather Mora,Heather Mora   Account Number:  [id number]  Date Initiated:  10/28/2014  Documentation initiated by:  Vance PeperBRADY,Deara Bober  Subjective/Objective Assessment:   80 yr old female admitted s/p fall with left proximal humerus fracture. Patient had a Left humerus ORIF. patient was independent with ADL's prior to admission.     Action/Plan:   Case manager spoke with patient and daughter Rayfield CitizenCaroline concerning home health and DME needs. Choice offered. Referral called [facility] Rump[facility]Mary Yonjof, [facility]. CM also provided list of private duty agencies for further assistance.   Anticipated DC Date:  10/28/2014   Anticipated DC Plan:  HOME W HOME HEALTH SERVICES      DC Planning Services  CM consult      [facility]  DURABLE MEDICAL EQUIPMENT   Choice offered to / List presented to:  C-4 Adult Children   DME arranged  3-N-1      DME agency  [facility]     HH arranged  HH-2 PT  HH-3 OT  HH-4 NURSE'S AIDE      [facility]   Status of service:  Completed, signed off Medicare Important Message given?   (If response is "NO", the following Medicare IM given date fields will be blank) Date Medicare IM given:   Medicare IM given by:   Date Additional Medicare IM given:   Additional Medicare IM given by:    Discharge Disposition:  HOME W HOME HEALTH SERVICES  Per UR Regulation:  Reviewed for med. necessity/level of care/duration of stay

## 2014-10-28 NOTE — Progress Notes (Signed)
Occupational Therapy Treatment Patient Details Name: Heather Mora MRN: [id number] DOB: 06-Aug-1930 Today's Date: 10/28/2014    History of present illness Pt is an 80 y/o female admitted s/p fall at home sustaining a proximal humerus fracture. Pt is now s/p ORIF L proximal humerus and is NWB. PMH signficant for Parkinson's Disease and mild dementia.    OT comments  Pt seen for acute OT treatment session. Pt and family heavily educated on techniques for ADLs, functional mobility and transfers for safe d/c home with family assisting. ADLs and exercises performed as detailed below.   Follow Up Recommendations  Home health OT;Supervision/Assistance - 24 hour    Equipment Recommendations  3 in 1 bedside comode    Recommendations for Other Services      Precautions / Restrictions Precautions Precautions: Fall;Shoulder Shoulder Interventions: Shoulder sling/immobilizer Precaution Booklet Issued: Yes (comment) Precaution Comments: Per MD, pt can wear sling during mobility and take off when resting (pillows under LUE).  Required Braces or Orthoses: Sling Restrictions Weight Bearing Restrictions: Yes LUE Weight Bearing: Non weight bearing       Mobility Bed Mobility               General bed mobility comments: pt up in chair and returned to chair  Transfers Overall transfer level: Needs assistance Equipment used: 1 person hand held assist;Straight cane Transfers: Sit to/from Stand Sit to Stand: Min guard         General transfer comment: cues for hand placement and technique; min guard to steady    Balance Overall balance assessment: Needs assistance;History of Falls Sitting-balance support: Feet supported;No upper extremity supported Sitting balance-Leahy Scale: Fair     Standing balance support: During functional activity;Single extremity supported Standing balance-Leahy Scale: Poor Standing balance comment: UE support to balance                    ADL Overall ADL's : Needs assistance/impaired                 Upper Body Dressing : Maximal assistance;Sitting   Lower Body Dressing: Sit to/from stand;Maximal assistance   Toilet Transfer: Minimal assistance;+2 for safety/equipment;Ambulation;RW (3n1 over toilet) Toilet Transfer Details (indicate cue type and reason): +2 for safety/equipment to have chair ready and manage IV pool with pt reporting feeling some lightheadness in standing/ambulating Toileting- Clothing Manipulation and Hygiene: Minimal assistance;Sit to/from stand       Functional mobility during ADLs: Minimal assistance;+2 for safety/equipment (hand held assist) General ADL Comments: Educated daughter and son-in-law on techniques for assiting with transfers/household distance ambulation, ADLs. Also discussed home setup strategies such as having chairs strategically placed through house in case pt fatigues while walking. Educated on always having someone providing min A as needed while OOB/mobility and using waistband of pants as point of control if needed. Pt c/o fatigue and lightheadness in standing/ambulation but able to walk to door and to bathroom with min A (hand-held) and compoleted toilret transfer)      Vision                     Perception     Praxis      Cognition   Behavior During Therapy: [facility] for tasks assessed/performed Overall Cognitive Status: Within Functional Limits for tasks assessed                       Extremity/Trunk Assessment  Exercises Hand Exercises Wrist Flexion: AROM;Left;10 reps;Seated Wrist Extension: AROM;Left;10 reps;Seated Digit Composite Flexion: AROM;Left;10 reps;Seated Composite Extension: AROM;Left;10 reps;Seated   Shoulder Instructions       General Comments      Pertinent Vitals/ Pain       Pain Assessment: No/denies pain Pain Score: 0-No pain Pain Location: LUE Pain Intervention(s): Limited activity within patient's  tolerance;Monitored during session;Repositioned;Patient requesting pain meds-RN notified  Home Living                                          Prior Functioning/Environment              Frequency Min 2X/week     Progress Toward Goals  OT Goals(current goals can now be found in the care plan section)  Progress towards OT goals: Progressing toward goals  Acute Rehab OT Goals Patient Stated Goal: to go home today OT Goal Formulation: With patient/family Time For Goal Achievement: 11/10/14 Potential to Achieve Goals: Good ADL Goals Pt Will Perform Upper Body Bathing: with min assist;sitting Pt Will Perform Upper Body Dressing: with mod assist;sitting Pt Will Transfer to Toilet: with supervision;stand pivot transfer;bedside commode Pt Will Perform Toileting - Clothing Manipulation and hygiene: with supervision;sit to/from stand Pt/caregiver will Perform Home Exercise Program: Increased ROM;Right Upper extremity;Independently  Plan Discharge plan remains appropriate    Co-evaluation                 End of Session Equipment Utilized During Treatment: Gait belt;Other (comment) (sling)   Activity Tolerance Patient tolerated treatment well   Patient Left in chair;with call bell/phone within reach;with family/visitor present   Nurse Communication Other (comment) (request waist strap order)    Functional Assessment Tool Used: clinical judgement Functional Limitation: Self care Self Care Current Status (B2841(G8987): At least 20 percent but less than 40 percent impaired, limited or restricted Self Care Goal Status (L2440(G8988): At least 1 percent but less than 20 percent impaired, limited or restricted   Time: 1051-1207 OT Time Calculation (min): 76 min  Charges: OT G-codes **NOT FOR INPATIENT CLASS** Functional Assessment Tool Used: clinical judgement Functional Limitation: Self care Self Care Current Status (N0272(G8987): At least 20 percent but less than 40  percent impaired, limited or restricted Self Care Goal Status (Z3664(G8988): At least 1 percent but less than 20 percent impaired, limited or restricted OT General Charges $OT Visit: 1 Procedure OT Treatments $Self Care/Home Management : 68-82 mins  Pilar GrammesMathews, Mando Blatz Mora 10/28/2014, 3:11 PM

## 2014-10-28 NOTE — Progress Notes (Signed)
   Subjective:  Patient reports pain as severe.  No acute events.  Objective:   VITALS:   Filed Vitals:   10/27/14 1533 10/27/14 2105 10/28/14 0042 10/28/14 0500  BP: 156/70 129/55 151/67 148/62  Pulse: 75 77 73 72  Temp: 97.5 F (36.4 C) 98.2 F (36.8 C) 98.1 F (36.7 C) 97.6 F (36.4 C)  TempSrc:  Oral Oral Oral  Resp: 16 17 16 17   Weight:      SpO2: 100% 100% 100% 100%    Neurologically intact Neurovascular intact Sensation intact distally Intact pulses distally Incision: dressing C/D/I and no drainage No cellulitis present Compartment soft   Lab Results  Component Value Date   WBC 9.2 10/27/2014   HGB 10.8* 10/27/2014   HCT 30.8* 10/27/2014   MCV 92.2 10/27/2014   PLT 199 10/27/2014     Assessment/Plan:  1 Day Post-Op   - Expected postop acute blood loss anemia - will monitor for symptoms - Up with PT/OT - recommending HHOT - DVT ppx - SCDs, ambulation - NWB left upper extremity - Pain control - Discharge planning - home today if pain controlled  Cheral AlmasXu, Naiping Michael 10/28/2014, 8:28 AM [phone]

## 2014-10-28 NOTE — Progress Notes (Signed)
Orthopedic Tech Progress Note Patient Details:  Mickle MalloryBernadette Plaut 01-19-1930 [id number] Should immobilizer applied to LUE. Tolerated well.  Ortho Devices Type of Ortho Device: Sling immobilizer Ortho Device/Splint Location: LUE Ortho Device/Splint Interventions: Application   Asia R Thompson 10/28/2014, 12:57 PM

## 2014-10-28 NOTE — Discharge Summary (Signed)
Physician Discharge Summary      Patient ID: Heather Mora MRN: [id number] DOB/AGE: May 05, 1930 80 y.o.  Admit date: 10/27/2014 Discharge date: 10/28/2014  Admission Diagnoses:  Left proximal humerus fracture  Discharge Diagnoses:  Active Problems:   Proximal humerus fracture   Past Medical History  Diagnosis Date  . Movement disorder   . Memory loss   . Parkinson disease   . Anxiety   . Dementia   . Inguinal hernia   . Arthritis   . Constipation     Surgeries: Procedure(s): OPEN REDUCTION INTERNAL FIXATION (ORIF) LEFT PROXIMAL HUMERUS FRACTURE on 10/27/2014   Consultants (if any):    Discharged Condition: Improved  Hospital Course: Heather Mora is an 80 y.o. female who was admitted 10/27/2014 with a diagnosis of left proximal humerus fracture and went to the operating room on 10/27/2014 and underwent the above named procedures.    She was given perioperative antibiotics:      Anti-infectives    Start     Dose/Rate Route Frequency Ordered Stop   10/27/14 1545  ceFAZolin (ANCEF) IVPB 1 g/50 mL premix     1 g100 mL/hr over 30 Minutes Intravenous Every 6 hours 10/27/14 1526 10/28/14 0454   10/27/14 0930  ceFAZolin (ANCEF) IVPB 2 g/50 mL premix     2 g100 mL/hr over 30 Minutes Intravenous On call to O.R. 10/27/14 [id number] 10/27/14 1202    .  She was given sequential compression devices, early ambulation for DVT prophylaxis.  She benefited maximally from the hospital stay and there were no complications.    Recent vital signs:  Filed Vitals:   10/28/14 0500  BP: 148/62  Pulse: 72  Temp: 97.6 F (36.4 C)  Resp: 17    Recent laboratory studies:  Lab Results  Component Value Date   HGB 10.8* 10/27/2014   HGB 10.6* 10/24/2014   Lab Results  Component Value Date   WBC 9.2 10/27/2014   PLT 199 10/27/2014   No results found for: INR Lab Results  Component Value Date   NA 132* 10/28/2014   K 4.1 10/28/2014   CL 98 10/28/2014   CO2 23 10/28/2014    BUN 16 10/28/2014   CREATININE 0.49* 10/28/2014   GLUCOSE 120* 10/28/2014    Discharge Medications:     Medication List    TAKE these medications        carbidopa-levodopa 50-200 MG per tablet  Commonly known as:  SINEMET CR  Take 1 tablet by mouth 3 (three) times daily.     cyclobenzaprine 10 MG tablet  Commonly known as:  FLEXERIL  Take 10 mg by mouth 2 (two) times daily.     HYDROcodone-acetaminophen 5-325 MG per tablet  Commonly known as:  NORCO  Take 1-2 tablets by mouth every 6 (six) hours as needed.     ibuprofen 200 MG tablet  Commonly known as:  ADVIL,MOTRIN  Take 400 mg by mouth every 6 (six) hours as needed for mild pain.     LORazepam 0.5 MG tablet  Commonly known as:  ATIVAN  Take 0.5 mg by mouth at bedtime.     MELATONIN PO  Take 1 tablet by mouth at bedtime.     multivitamin with minerals Tabs tablet  Take 1 tablet by mouth daily.     NAMENDA XR 28 MG Cp24  Generic drug:  Memantine HCl ER  Take 28 mg by mouth at bedtime.     NEUPRO 4 MG/24HR  Generic drug:  rotigotine  Place 1 patch onto the skin daily.     oxyCODONE-acetaminophen 5-325 MG per tablet  Commonly known as:  PERCOCET/ROXICET  Take 2 tablets by mouth every 6 (six) hours as needed for severe pain.     rivastigmine 4.6 mg/24hr  Commonly known as:  EXELON  Place 1 patch onto the skin daily.        Diagnostic Studies: Dg Chest 1 View  10/24/2014   CLINICAL DATA:  Larey Seat 2 weeks ago.  Left-sided rib pain since then  EXAM: CHEST - 1 VIEW  COMPARISON:  None.  FINDINGS: Heart size is normal. Mediastinal shadows are normal except for calcification of the thoracic aorta. There is no pneumothorax or hemothorax. There is chronic pleural and parenchymal scarring at the apices with calcification of the pleural surfaces. No effusion. No evidence of pneumonia or volume loss. There are old healed rib fractures on the right at ribs 8 and 9. No left-sided fracture is seen.  IMPRESSION: No active  disease.  No acute finding.   Electronically Signed   By: Paulina Fusi M.D.   On: 10/24/2014 23:44   Dg Wrist Complete Left  10/24/2014   CLINICAL DATA:  Larey Seat.  Wrist pain.  EXAM: LEFT WRIST - COMPLETE 3+ VIEW  COMPARISON:  None.  FINDINGS: There is dorsal tilt of the distal radial articular surface. No cortical break is discernible, and it is probable that this configuration relates to an old healed Colles fracture. I cannot rule out a subtle component of acute compaction, but favor that the injury is old. No other sign of acute fracture.  IMPRESSION: Apparent old Colles fracture of the distal radius with dorsal tilt of the distal radial articular surface. I do not see an acute cortical break.   Electronically Signed   By: Paulina Fusi M.D.   On: 10/24/2014 23:25   Ct Humerus Left Wo Contrast  10/27/2014   CLINICAL DATA:  Nonspecific (abnormal) findings on radiological and other examination of musculoskeletal sysem.  Left proximal humeral fracture. Status post fall 2 days ago. Preop for surgery scheduled 10/27/2014.  EXAM: CT OF THE LEFT HUMERUS WITHOUT INTRAVENOUS CONTRAST; 3-DIMENSIONAL CT IMAGE RENDERING ON INDEPENDENT WORKSTATION  TECHNIQUE: Multiple axial images of the left shoulder with coronal and sagittal multi planar reformatted images provided; 3-dimensional CT images were rendered by post-processing of the original CT data on an independent workstation. The 3-dimensional CT images were interpreted and findings were reported in the accompanying complete CT report for this study  COMPARISON:  None.  FINDINGS: Significant patient motion severely degrading image quality. Patient has history of Parkinson's disease.  There is a comminuted fracture of the surgical neck of the left proximal humerus. The fracture cleft appears to involve the greater tuberosity and the lesser tuberosity.  No glenohumeral dislocation. The [facility] joint is congruent. There is no lytic or sclerotic osseous lesion.  There is  calcified left apical pleural plaque with adjacent spiculated airspace disease likely reflecting postinflammatory or postinfectious scarring.  IMPRESSION: 1. Comminuted fracture of the surgical neck of the left proximal humerus. The fracture cleft appears to involve the greater tuberosity and the lesser tuberosity. 2. There is calcified left apical pleural plaque with adjacent spiculated airspace disease likely reflecting postinflammatory or postinfectious scarring. Given that there are no prior examinations for comparison to ensure stability, a follow-up CT of the chest is recommended in 6 months.   Electronically Signed   By: Elige Ko   On: 10/27/2014 09:03   Ct 3d  Independent Wkst  10/27/2014   CLINICAL DATA:  Nonspecific (abnormal) findings on radiological and other examination of musculoskeletal sysem.  Left proximal humeral fracture. Status post fall 2 days ago. Preop for surgery scheduled 10/27/2014.  EXAM: CT OF THE LEFT HUMERUS WITHOUT INTRAVENOUS CONTRAST; 3-DIMENSIONAL CT IMAGE RENDERING ON INDEPENDENT WORKSTATION  TECHNIQUE: Multiple axial images of the left shoulder with coronal and sagittal multi planar reformatted images provided; 3-dimensional CT images were rendered by post-processing of the original CT data on an independent workstation. The 3-dimensional CT images were interpreted and findings were reported in the accompanying complete CT report for this study  COMPARISON:  None.  FINDINGS: Significant patient motion severely degrading image quality. Patient has history of Parkinson's disease.  There is a comminuted fracture of the surgical neck of the left proximal humerus. The fracture cleft appears to involve the greater tuberosity and the lesser tuberosity.  No glenohumeral dislocation. The [facility] joint is congruent. There is no lytic or sclerotic osseous lesion.  There is calcified left apical pleural plaque with adjacent spiculated airspace disease likely reflecting postinflammatory or  postinfectious scarring.  IMPRESSION: 1. Comminuted fracture of the surgical neck of the left proximal humerus. The fracture cleft appears to involve the greater tuberosity and the lesser tuberosity. 2. There is calcified left apical pleural plaque with adjacent spiculated airspace disease likely reflecting postinflammatory or postinfectious scarring. Given that there are no prior examinations for comparison to ensure stability, a follow-up CT of the chest is recommended in 6 months.   Electronically Signed   By: Elige KoHetal  Patel   On: 10/27/2014 09:03   Dg Shoulder Left  10/27/2014   CLINICAL DATA:  Proximal left humeral fracture.  ORIF.  EXAM: DG C-ARM 61-120 MIN; LEFT SHOULDER - 2+ VIEW  COMPARISON:  CT 10/26/2014.  FLUOROSCOPY TIME: C-arm fluoroscopic images were obtained intraoperatively and submitted for post operative interpretation. Please see the performing provider's procedural report for the fluoroscopy time utilized.  FINDINGS: Two spot fluoroscopic images demonstrate plate and screw fixation of the comminuted proximal left humeral fracture. The main fracture fragments are well-aligned. No dislocation or other complication identified.  IMPRESSION: Near anatomic reduction of proximal left humeral fracture post ORIF.   Electronically Signed   By: Roxy HorsemanBill  Veazey M.D.   On: 10/27/2014 15:02   Dg Shoulder Left  10/24/2014   CLINICAL DATA:  Status post fall tonight with a blow to the left shoulder. Left shoulder pain limited range of motion.  EXAM: LEFT SHOULDER - 2+ VIEW  COMPARISON:  None.  FINDINGS: The patient has an acute surgical neck fracture of the left humerus. There is approximately 1 shaft width anterior and 3.5 cm superior displacement of the distal fragment. The fracture does not appear to involve the tuberosities. The humeral head is located. The acromioclavicular joint is intact. Imaged left lung and ribs appear normal.  IMPRESSION: Acute surgical neck fracture left humerus as described.    Electronically Signed   By: Drusilla Kannerhomas  Dalessio M.D.   On: 10/24/2014 21:52   Dg C-arm 1-60 Min  10/27/2014   CLINICAL DATA:  Proximal left humeral fracture.  ORIF.  EXAM: DG C-ARM 61-120 MIN; LEFT SHOULDER - 2+ VIEW  COMPARISON:  CT 10/26/2014.  FLUOROSCOPY TIME: C-arm fluoroscopic images were obtained intraoperatively and submitted for post operative interpretation. Please see the performing provider's procedural report for the fluoroscopy time utilized.  FINDINGS: Two spot fluoroscopic images demonstrate plate and screw fixation of the comminuted proximal left humeral fracture. The main fracture  fragments are well-aligned. No dislocation or other complication identified.  IMPRESSION: Near anatomic reduction of proximal left humeral fracture post ORIF.   Electronically Signed   By: Roxy Horseman M.D.   On: 10/27/2014 15:02    Disposition: 01-Home or Self Care  Discharge Instructions    Call MD / Call 911    Complete by:  As directed   If you experience chest pain or shortness of breath, CALL 911 and be transported to the hospital emergency room.  If you develope a fever above 101.5 F, pus (white drainage) or increased drainage or redness at the wound, or calf pain, call your surgeon's office.     Constipation Prevention    Complete by:  As directed   Drink plenty of fluids.  Prune juice may be helpful.  You may use a stool softener, such as Colace (over the counter) 100 mg twice a day.  Use MiraLax (over the counter) for constipation as needed.     Diet - low sodium heart healthy    Complete by:  As directed      Diet general    Complete by:  As directed      Driving restrictions    Complete by:  As directed   No driving while taking narcotic pain meds.     Increase activity slowly as tolerated    Complete by:  As directed      Non weight bearing    Complete by:  As directed            Follow-up Information    Follow up with [name], MD In 2 weeks.   Specialty:  Orthopedic  Surgery   Why:  For wound re-check   Contact information:   [address] [place] [address] [phone]        Signed: [name] 10/28/2014, 11:21 AM

## 2015-01-18 ENCOUNTER — Encounter: Payer: Self-pay | Admitting: Occupational Therapy

## 2015-01-18 ENCOUNTER — Ambulatory Visit: Payer: Medicare Other | Attending: Family Medicine | Admitting: Physical Therapy

## 2015-01-18 ENCOUNTER — Encounter: Payer: Self-pay | Admitting: Physical Therapy

## 2015-01-18 ENCOUNTER — Ambulatory Visit: Payer: Medicare Other | Attending: Orthopaedic Surgery | Admitting: Occupational Therapy

## 2015-01-18 DIAGNOSIS — Z9181 History of falling: Secondary | ICD-10-CM | POA: Insufficient documentation

## 2015-01-18 DIAGNOSIS — G20 Parkinson's disease: Secondary | ICD-10-CM | POA: Insufficient documentation

## 2015-01-18 DIAGNOSIS — F0280 Dementia in other diseases classified elsewhere without behavioral disturbance: Secondary | ICD-10-CM | POA: Insufficient documentation

## 2015-01-18 DIAGNOSIS — M6281 Muscle weakness (generalized): Secondary | ICD-10-CM | POA: Insufficient documentation

## 2015-01-18 DIAGNOSIS — R258 Other abnormal involuntary movements: Secondary | ICD-10-CM

## 2015-01-18 DIAGNOSIS — R296 Repeated falls: Secondary | ICD-10-CM

## 2015-01-18 DIAGNOSIS — R279 Unspecified lack of coordination: Secondary | ICD-10-CM | POA: Diagnosis not present

## 2015-01-18 DIAGNOSIS — M25612 Stiffness of left shoulder, not elsewhere classified: Secondary | ICD-10-CM | POA: Diagnosis present

## 2015-01-18 DIAGNOSIS — R269 Unspecified abnormalities of gait and mobility: Secondary | ICD-10-CM | POA: Diagnosis present

## 2015-01-18 NOTE — Therapy (Signed)
[facility] [address] [place], [place], [address] Phone: [phone]   Fax:  [phone]  Physical Therapy Evaluation  Patient Details  Name: Heather MalloryBernadette Jindra MRN: [id number] Date of Birth: [birth year] Referring Provider:  Barbie BannerWilson, Fred H, MD  Encounter Date: 01/18/2015      PT End of Session - 01/18/15 1244    Visit Number 1   Number of Visits 17   Date for PT Re-Evaluation 03/19/15   Authorization Type Medicare G-code every 10th visit   PT Start Time 1153   PT Stop Time 1233   PT Time Calculation (min) 40 min   Equipment Utilized During Treatment Gait belt      Past Medical History  Diagnosis Date  . Movement disorder   . Memory loss   . Parkinson disease   . Anxiety   . Dementia   . Inguinal hernia   . Arthritis   . Constipation     Past Surgical History  Procedure Laterality Date  . Hernia repair    . Back surgery    . Eye surgery Bilateral     cataract surgery with lens implant  . Eye surgery      laser surgery  . Colonoscopy    . Orif humerus fracture Left 10/27/2014    Procedure: OPEN REDUCTION INTERNAL FIXATION (ORIF) LEFT PROXIMAL HUMERUS FRACTURE;  Surgeon: Cheral AlmasNaiping Michael Xu, MD;  Location: [facility] OR;  Service: Orthopedics;  Laterality: Left;    There were no vitals taken for this visit.  Visit Diagnosis:  Abnormality of gait - Plan: PT plan of care cert/re-cert  Bradykinesia - Plan: PT plan of care cert/re-cert  Falls frequently - Plan: PT plan of care cert/re-cert      Subjective Assessment - 01/18/15 1153    Symptoms Pt is an 81 year old female who presents to OP PT with Parkinson's disease for at least 10 years.  Pt has experienced Parkinson's dementia, blood pressure issues, dyskinesias, bradykinesia, falls, balance, lower extremity weakness, freezing episodes.  Pt  has had  4 falls since November, with one fall causing L humeral fracture.  Pt does not use assistive device.  She  tends to reach out for furniture while walking.   Pertinent History Parkinson's disease at least 10 years; neurologist in [place]   Patient Stated Goals Pt's goal is to prevent further falls.          [facility] PT Assessment - 01/18/15 1204    Assessment   Medical Diagnosis Parkinson's disease   Onset Date --  >10 years ago   Precautions   Precautions Fall   Balance Screen   Has the patient fallen in the past 6 months Yes   How many times? 4   Has the patient had a decrease in activity level because of a fear of falling?  Yes   Is the patient reluctant to leave their home because of a fear of falling?  Yes   Home Environment   Living Enviornment Private residence   Living Arrangements Children  Lives split time [place] d[place]ther[place] in [place]   Available Help at Discharge Family   Type of Home House   Home Access Stairs to enter   Entrance Stairs-Number of Steps 2   Entrance Stairs-Rails None   Home Layout Two level   Alternate Level Stairs-Number of Steps --  flight of steps   Alternate Level Stairs-Rails Right   Home Equipment Bedside commode   Prior Function  Level of Independence Independent with gait;Independent with transfers  Daughter reports mobility decline since November 2015   Strength   Overall Strength Other (comment)  grossly tested 4/5 hip and knees; ankle dorsiflexion 3/5   Overall Strength Comments decreased functional strength with UE support needed for sit<>stand   Transfers   Transfers Sit to Stand;Stand to Sit   Sit to Stand From chair/3-in-1;With upper extremity assist;5: Supervision;With armrests  increased difficulty with sit<>stand   Sit to Stand Details (indicate cue type and reason) uses UE support for sit<>stand; reports often takes more than one try to stand   Stand to Sit 5: Supervision;With upper extremity assist;To chair/3-in-1;With armrests   Stand to Sit Details With attempts to sit from walking, pt does not fully turn to sit, reaching for  armrests of chair   Ambulation/Gait   Ambulation/Gait Yes   Ambulation/Gait Assistance 4: Min assist   Ambulation/Gait Assistance Details Pt reaches out for furniture and walls for support   Ambulation Distance (Feet) 100 Feet   Assistive device None   Gait Pattern Decreased step length - right;Decreased step length - left;Decreased stance time - right;Decreased stance time - left;Decreased stride length;Decreased dorsiflexion - right;Decreased dorsiflexion - left;Right foot flat;Left foot flat;Right flexed knee in stance;Left flexed knee in stance;Shuffle;Decreased trunk rotation;Narrow base of support;Poor foot clearance - left;Poor foot clearance - right  decreased bilateral arm swing   Ambulation Surface Indoor;Level   Gait velocity 25.5 seconds=1.29 ft/sec   Gait Comments With cues for large amplitude movements, pt is able to achieve heelstrike, increased step length, but continues with narrow base of support and is not able to continue for > 10-15 ft. with HHA prior to additional cues being given.   Standardized Balance Assessment   Standardized Balance Assessment Timed Up and Go Test;Berg Balance Test  Berg <45/56 indicates high fall risk   Berg Balance Test   Sit to Stand Able to stand  independently using hands   Standing Unsupported Able to stand 2 minutes with supervision   Sitting with Back Unsupported but Feet Supported on Floor or Stool Able to sit safely and securely 2 minutes   Stand to Sit Controls descent by using hands   Transfers Able to transfer with verbal cueing and /or supervision   Standing Unsupported with Eyes Closed Able to stand 10 seconds with supervision   Standing Ubsupported with Feet Together Able to place feet together independently but unable to hold for 30 seconds   From Standing, Reach Forward with Outstretched Arm Reaches forward but needs supervision   From Standing Position, Pick up Object from Floor Unable to try/needs assist to keep balance   From  Standing Position, Turn to Look Behind Over each Shoulder Needs supervision when turning   Turn 360 Degrees Needs close supervision or verbal cueing   Standing Unsupported, Alternately Place Feet on Step/Stool Needs assistance to keep from falling or unable to try   Standing Unsupported, One Foot in [employer] balance while stepping or standing   Standing on One Leg Unable to try or needs assist to prevent fall   Total Score 23   Timed Up and Go Test   TUG Normal TUG   Normal TUG (seconds) 44.22  >13.5 sec=fall risk; >30 sec=difficulty with ADLs in home                            PT Short Term Goals - 01/18/15 1304  PT SHORT TERM GOAL #1   Title Pt will perform HEP for improved transfers, balance, and gait, with family's assistance. (Target 02/17/15)   Time 4   Period Weeks   Status New   PT SHORT TERM GOAL #2   Title Pt will perform at least 8 of 10 reps of sit<>stand transfers with minimal UE support for improved transfer efficiency and safety.   Time 4   Period Weeks   Status New   PT SHORT TERM GOAL #3   Title improve Berg Balance score to at least 28/56 for decreased fall risk.   Time 4   Period Weeks   Status New   PT SHORT TERM GOAL #4   Title improve TUG score to less than or equal to 35 seconds for decreased fall risk.   Time 4   Period Weeks   Status New           PT Long Term Goals - 01/18/15 1307    PT LONG TERM GOAL #1   Title Pt/family will verbalize understanding of fall prevention techniques within home environment. (Target 03/19/15)   Time 8   Period Weeks   Status New   PT LONG TERM GOAL #2   Title verbalize understanding of tips to reduce freezing episodes with gait and turns.   Time 8   Period Weeks   Status New   PT LONG TERM GOAL #3   Title improve Berg Balance score to at least 38/56 for decreased fall risk.   Time 8   Period Weeks   Status New   PT LONG TERM GOAL #4   Title improve TUG score to less than or equal to  25 seconds for decreased fall risk.   Time 8   Period Weeks   Status New   PT LONG TERM GOAL #5   Title Pt/family will verbalize understanding of continued community fitness upon D/C from PT.   Time 8   Period Weeks   Status New               Plan - 01/18/15 1257    Clinical Impression Statement Pt is an 81 year old female who presents to OP PT with history of recent falls-4 falls since November, with one fall causing arm fracture and other falls requiring stitches.  Per family report, they have noticed functional deline since November 2015.  Pt does not ambulate with assistive device.  She presents with decreased balance, freezing episodes with gait and turns, bradykinesia, dystonia, decreased timing and coordination of gait, and recent history of frequent falls.  Pt would benefit from skilled PT to address those functional mobility issues.   Pt will benefit from skilled therapeutic intervention in order to improve on the following deficits Abnormal gait;Decreased balance;Decreased mobility;Decreased knowledge of use of DME;Decreased safety awareness;Decreased strength;Difficulty walking;Postural dysfunction   Rehab Potential Good   Clinical Impairments Affecting Rehab Potential Parkinson's dementia   PT Frequency 2x / week   PT Duration 8 weeks  plus evaluation   PT Treatment/Interventions ADLs/Self Care Home Management;DME Instruction;Gait training;Functional mobility training;Neuromuscular re-education;Balance training;Therapeutic exercise;Therapeutic activities;Patient/family education   PT Next Visit Plan Initiate HEP; trial walking with walker/rollator in clinic (daugther not sure there is room to use in home); balance, gait activities; use of machines for intensity   PT Home Exercise Plan sitting and standing PWR! Moves exercises, standing counter exercises, hamstring stretches   Consulted and Agree with Plan of Care Patient;Family member/caregiver   Family Member Consulted  daughter          G-Codes - 01/18/15 1311    Functional Assessment Tool Used Sharlene Motts 23/56, TUG 44.22 seconds, 1.29 ft/sec gait velocity; 4 falls since November 2015   Functional Limitation Mobility: Walking and moving around   Mobility: Walking and Moving Around Current Status [phone]) At least 60 percent but less than 80 percent impaired, limited or restricted   Mobility: Walking and Moving Around Goal Status [phone]) At least 20 percent but less than 40 percent impaired, limited or restricted       Problem List Patient Active Problem List   Diagnosis Date Noted  . Proximal humerus fracture 10/27/2014  . Parkinson's disease 07/26/2013  . Parkinson's disease dementia 07/26/2013  . WEIGHT LOSS, ABNORMAL 09/13/2010  . ANXIETY 09/12/2010  . PARKINSON'S DISEASE 09/12/2010  . CONSTIPATION 09/12/2010  . BACK PAIN, LUMBOSACRAL, CHRONIC 09/12/2010  . NAUSEA WITH VOMITING 09/12/2010  . PERSONAL HISTORY OF COLONIC POLYPS 09/12/2010    Jodette Wik W. 01/18/2015, 1:15 PM  Lonia Blood, PT 01/18/2015 1:15 PM Phone: [phone] Fax: [phone]   [facility] [address] [place], [place], [address] Phone: [phone]   Fax:  [phone]

## 2015-01-18 NOTE — Therapy (Signed)
[facility] [address] [place], [place], [address] Phone: [phone]   Fax:  [phone]  Occupational Therapy Evaluation  Patient Details  Name: Heather Mora MRN: [id number] Date of Birth: [birth year] Referring Provider:  [name], MD  Encounter Date: 01/18/2015      OT End of Session - 01/18/15 1342    Visit Number 1  G1   Number of Visits 17   Date for OT Re-Evaluation 03/18/15   Authorization Type MCR - G code needed   Authorization Time Period 60 days   OT Start Time 1105   OT Stop Time 1150   OT Time Calculation (min) 45 min   Activity Tolerance Patient tolerated treatment well      Past Medical History  Diagnosis Date  . Movement disorder   . Memory loss   . Parkinson disease   . Anxiety   . Dementia   . Inguinal hernia   . Arthritis   . Constipation     Past Surgical History  Procedure Laterality Date  . Hernia repair    . Back surgery    . Eye surgery Bilateral     cataract surgery with lens implant  . Eye surgery      laser surgery  . Colonoscopy    . Orif humerus fracture Left 10/27/2014    Procedure: OPEN REDUCTION INTERNAL FIXATION (ORIF) LEFT PROXIMAL HUMERUS FRACTURE;  Surgeon: [name], MD;  Location: [facility] OR;  Service: Orthopedics;  Laterality: Left;    There were no vitals taken for this visit.  Visit Diagnosis:  Generalized muscle weakness - Plan: OT PLAN OF CARE CERT/RE-CERT  Stiffness of shoulder joint, left - Plan: OT PLAN OF CARE CERT/RE-CERT  Lack of coordination - Plan: OT PLAN OF CARE CERT/RE-CERT      Subjective Assessment - 01/18/15 1113    Currently in Pain? Yes   Pain Score 4    Pain Location Shoulder   Pain Orientation Left   Pain Descriptors / Indicators Sore;Aching   Pain Type Chronic pain   Pain Onset More than a month ago   Pain Frequency Intermittent  with movement   Aggravating Factors  movement   Pain Relieving Factors  rest, OTC meds          [facility] OT Assessment - 01/18/15 1117    Assessment   Diagnosis Lt proximal humerus fx w/ ORIF on 10/27/14 (from fall)   Onset Date 10/27/14   Assessment Pt also w/ Parkinson's x 10 years   Prior Therapy home health in [place], outpatient in [place]   Precautions   Precautions Fall  DUE TO PARKINSONS   Precaution Comments S   Balance Screen   Has the patient fallen in the past 6 months Yes   How many times? 4   Has the patient had a decrease in activity level because of a fear of falling?  Yes   Is the patient reluctant to leave their home because of a fear of falling?  Yes   Home  Environment   Family/patient expects to be discharged to: Private residence   Living Arrangements Children   Type of Home House   Home Access Stairs   Home Layout Two level   Bathroom Shower/Tub Walk-in Shower;Door  using [facility] for shower seat   Additional Comments Has BSC but no longer using.    Lives With Daughter  6 months w/ 1 in [place], and another daughter 6 months in [place]  Prior Function   Level of Independence Independent with basic ADLs   and light cooking prior to fall and surgery on 10/27/14   Vocation Retired   ADL   Eating/Feeding Independent   Grooming Independent   Product managerUpper Body Bathing Modified independent   Lower Body Bathing Modified independent   Upper Body Dressing Minimal assistance   Lower Body Dressing Modified independent   Toilet Tranfer Min guard   Toileting - Clothing Manipulation Modified independent   Toileting -  Engineer, miningHygiene Modified Independent   Tub/Shower Transfer Supervision/safety   ADL comments Dependent for all IADLS   IADL   Medication Management Has difficulty remembering to take medication  Requires cues AND assist from family   Written Expression   Dominant Hand Right   Handwriting 90% legible  for name only   Vision - History   Baseline Vision Wears glasses all the time  but pt only wears for reading   Observation/Other Assessments    Observations Pt w/ decreased cognition: decreased memory and processing speed d/t Parkinsons   Coordination   9 Hole Peg Test Rt = 39.47 sec. Lt = 48.16 sec.    Box and Blocks Rt = 43, Lt = 34   Right 9 Hole Peg Test 39.47 sec   Left 9 Hole Peg Test 48.16 sec   AROM   Overall AROM Comments RUE WNL's (noted Dupuetryns Rt hand 4th-5th digits). LUE: shoulder flex = 70-75*, abd = 70*, ER 75%, IR 50%, elbow flex WFL's, elbow ext = -25*, forearm distally WNL's.    Strength   Overall Strength Comments Grip Rt = 35 lbs, Lt = 20 lbs. RUE MMT grossly 4/5 at shoulder, LUE MMT grossly 3+/5 at shoulder except IR 4/5                         OT Short Term Goals - 01/18/15 1349    OT SHORT TERM GOAL #1   Title Independent w/ initial HEP (DUE 02/16/15)   Time 4   Period Weeks   Status New   OT SHORT TERM GOAL #2   Title Lt grip strength to increase to 25 lbs or greater    Baseline 20 lbs (Rt = 35 lbs)   Time 4   Period Weeks   Status New   OT SHORT TERM GOAL #3   Title Lt shoulder to perform 90* flexion for functional mid level reaching activities    Baseline 70-75*   Time 4   Period Weeks   Status New   OT SHORT TERM GOAL #4   Title Pt to report pain less than or equal to 3/10 Lt shoulder w/ ROM ex's and functional reaching    Baseline 4-5/10    Time 4   Period Weeks   Status New           OT Long Term Goals - 01/18/15 1352    OT LONG TERM GOAL #1   Title Independent w/ updated HEP (due 03/18/15)   Time 8   Period Weeks   Status New   OT LONG TERM GOAL #2   Title Pt to improve coordination as evidenced by reducing speed on 9 hole peg test to 40 sec. or less    Baseline eval = 48.16 sec.    Time 8   Period Weeks   Status New   OT LONG TERM GOAL #3   Title Pt to perfom functional reaching at 100* Lt shoulder flexion  Baseline eval = 70-75*   Time 8   Period Weeks   Status New   OT LONG TERM GOAL #4   Title Pt to perform all bathing/dressing Mod I level  safely    Baseline min assist   Time 8   Period Weeks   Status New               Plan - Feb 15, 2015 1344    Clinical Impression Statement Pt is a 81 y.o. female who presents to outpatient rehab s/p ORIF Lt proximal humerus on 10/27/14 (from fall). Pt also has Parkinsons disease which places her at high fall risk. Pt w/ cognitive deficits, decreased Lt shoulder ROM and decreased grip strength.    Rehab Potential Fair   OT Frequency 2x / week   OT Duration 8 weeks  plus eval   OT Treatment/Interventions Self-care/ADL training;Electrical Stimulation;Therapeutic exercise;Cognitive remediation/compensation;Moist Heat;Neuromuscular education;Functional Mobility Training;Patient/family education;Therapeutic activities;Manual Therapy;Passive range of motion;DME and/or AE instruction;Ultrasound   Plan issue HEP for Lt shoulder (flexion, abduction, extension, ER, IR) and putty ex's for Lt grip strength          G-Codes - 02-15-15 1357    Functional Assessment Tool Used Lt shoulder flexion 70-75 degrees, 9 hole peg test Lt = 48.16 sec., grip strength Lt = 20 lbs   Functional Limitation Carrying, moving and handling objects   Carrying, Moving and Handling Objects Current Status (Z6109) At least 60 percent but less than 80 percent impaired, limited or restricted   Carrying, Moving and Handling Objects Goal Status (U0454) At least 20 percent but less than 40 percent impaired, limited or restricted      Problem List Patient Active Problem List   Diagnosis Date Noted  . Proximal humerus fracture 10/27/2014  . Parkinson's disease 07/26/2013  . Parkinson's disease dementia 07/26/2013  . WEIGHT LOSS, ABNORMAL 09/13/2010  . ANXIETY 09/12/2010  . PARKINSON'S DISEASE 09/12/2010  . CONSTIPATION 09/12/2010  . BACK PAIN, LUMBOSACRAL, CHRONIC 09/12/2010  . NAUSEA WITH VOMITING 09/12/2010  . PERSONAL HISTORY OF COLONIC POLYPS 09/12/2010    Kelli Churn, OTR/L 02-15-2015, 2:01 PM  [facility] [address] [place], [place], [address] Phone: [phone]   Fax:  [phone]

## 2015-01-24 ENCOUNTER — Ambulatory Visit: Payer: Medicare Other | Admitting: Physical Therapy

## 2015-01-24 DIAGNOSIS — R269 Unspecified abnormalities of gait and mobility: Secondary | ICD-10-CM

## 2015-01-24 DIAGNOSIS — R296 Repeated falls: Secondary | ICD-10-CM

## 2015-01-24 DIAGNOSIS — R258 Other abnormal involuntary movements: Secondary | ICD-10-CM

## 2015-01-24 NOTE — Patient Instructions (Signed)
Tips to reduce freezing episodes with standing or walking:  1. Stand tall with your feet wide, so that you can rock and weight shift through your hips. 2. Don't try to fight the freeze: if you begin taking slower, faster, smaller steps, STOP, get your posture tall, and RESET your posture and balance.  Take a deep breath before taking the BIG step to start again. 3. March in place, with high knee stepping, to get started walking again. 4. Use auditory cues:  Count out loud, think of a familiar tune or song or cadence, use pocket metronome, to use rhythm to get started walking again. 5. Use visual cues:  Use a line to step over, use laser pointer line to step over, (using BIG steps) to start walking again. 6. Use visual targets to keep your posture tall (look ahead and focus on an object or target at eye level). 7. As you approach where your destination with walking, count your steps out loud and/or focus on your target with your eyes until you are fully there. 8. Use appropriate assistive device, as advised by your physical therapist to assist with taking longer, consistent steps. 9.    FUNCTIONAL MOBILITY: Marching - Standing   March in place by lifting left leg up, then right. Alternate. Practice marching turns, a full turn around, with holding onto chair and counter.  Make sure to lift legs high, keep feet shoulder-width apart and take your time for SLOW, DELIBERATE STEPS.  Don't Rush!  This is practice for turning to sit without shuffling your feet.   Copyright  VHI. All rights reserved.  Leg Extension (Hamstring)   Sit toward front edge of chair, with leg out straight, heel on floor, toes pointing toward body. Keeping back straight, bend forward at hip, breathing out through pursed lips. Return, breathing in. Repeat ___3 times. Hold for 30 seconds.  Repeat with other leg. Do _3__ sessions per day. Variation: Perform from standing position, with support.  Copyright  VHI. All rights  reserved.

## 2015-01-24 NOTE — Therapy (Signed)
[facility] [address] [place], [place], [address] Phone: [phone]   Fax:  [phone]  Physical Therapy Treatment  Patient Details  Name: Heather Mora MRN: [id number] Date of Birth: 01/26/1930 Referring Provider:  Barbie Banner, MD  Encounter Date: 01/24/2015      PT End of Session - 01/25/15 0921    Visit Number 2   Number of Visits 17   Date for PT Re-Evaluation 03/19/15   Authorization Type Medicare G-code every 10th visit   PT Start Time 1019   PT Stop Time 1100   PT Time Calculation (min) 41 min   Equipment Utilized During Treatment Gait belt   Activity Tolerance Patient tolerated treatment well      Past Medical History  Diagnosis Date  . Movement disorder   . Memory loss   . Parkinson disease   . Anxiety   . Dementia   . Inguinal hernia   . Arthritis   . Constipation     Past Surgical History  Procedure Laterality Date  . Hernia repair    . Back surgery    . Eye surgery Bilateral     cataract surgery with lens implant  . Eye surgery      laser surgery  . Colonoscopy    . Orif humerus fracture Left 10/27/2014    Procedure: OPEN REDUCTION INTERNAL FIXATION (ORIF) LEFT PROXIMAL HUMERUS FRACTURE;  Surgeon: [name], MD;  Location: [facility] OR;  Service: Orthopedics;  Laterality: Left;    There were no vitals taken for this visit.  Visit Diagnosis:  Abnormality of gait  Bradykinesia  Falls frequently      Subjective Assessment - 01/24/15 1020    Symptoms (p) No changes, no falls  since evaluation.     Currently in Pain? (p) No/denies       Gait:  To assist with gait training and facilitation of increased step length, gait trial with rolling walker x 120 ft, with min guard assistance, with walker occasionally veering to left, needing therapist's assistance to reposition.  Pt noted to have several instances of freezing episodes upon initiation of gait using RW.  Initial  cues given for increased step length, with pt noted to have narrow base of support, near-scissoring gait pattern with L foot crossing near midline.  Trial of gait with  4-wheeled rollator walker x 400 ft with improved posture, improved step length and improved ability to maneuver walker, with close superivision.  Gait training without device x 200 ft with 1 HHA, pt ambulates with increased step length.  Gait training x 400 ft with min guard assistance, no device, no HHA, with cues for increased step length and increased reciprocal arm swing.   Short distance and turning practice due to pt/daughter's reports of near falls with turns in small spaces, especially preparing to sit.  Discussed/provided information/demonstrated tips to reduce freezing.  Gait activities with turning to sit, using side-step and marching to turn technique with minimal assistance and verbal cues.  Short distance gait with turn to sit using marching technique to turn.  At counter, practiced marching turns with UE support of counter and chair.  TherEx:  Seated hamstring stretch 1 x 30 seconds each leg with lower extremity propped on floor, then 3 x 30 seconds each leg with lower extremity propped on foot stool.  Provided pictures as HEP.  PT Education - 01/25/15 0920    Education provided Yes   Education Details tips to reduce freezing, HEP-marching turns and seated hamstring stretches   Person(s) Educated Patient;Child(ren)   Methods Explanation;Demonstration;Handout   Comprehension Verbalized understanding;Returned demonstration;Need further instruction          PT Short Term Goals - 01/18/15 1304    PT SHORT TERM GOAL #1   Title Pt will perform HEP for improved transfers, balance, and gait, with family's assistance. (Target 02/17/15)   Time 4   Period Weeks   Status New   PT SHORT TERM GOAL #2   Title Pt will perform at least 8 of 10 reps of sit<>stand transfers with minimal UE  support for improved transfer efficiency and safety.   Time 4   Period Weeks   Status New   PT SHORT TERM GOAL #3   Title improve Berg Balance score to at least 28/56 for decreased fall risk.   Time 4   Period Weeks   Status New   PT SHORT TERM GOAL #4   Title improve TUG score to less than or equal to 35 seconds for decreased fall risk.   Time 4   Period Weeks   Status New           PT Long Term Goals - 01/18/15 1307    PT LONG TERM GOAL #1   Title Pt/family will verbalize understanding of fall prevention techniques within home environment. (Target 03/19/15)   Time 8   Period Weeks   Status New   PT LONG TERM GOAL #2   Title verbalize understanding of tips to reduce freezing episodes with gait and turns.   Time 8   Period Weeks   Status New   PT LONG TERM GOAL #3   Title improve Berg Balance score to at least 38/56 for decreased fall risk.   Time 8   Period Weeks   Status New   PT LONG TERM GOAL #4   Title improve TUG score to less than or equal to 25 seconds for decreased fall risk.   Time 8   Period Weeks   Status New   PT LONG TERM GOAL #5   Title Pt/family will verbalize understanding of continued community fitness upon D/C from PT.   Time 8   Period Weeks   Status New               Plan - 01/25/15 1610    Clinical Impression Statement Initiated HEP to address hamstring tightness (knee flexion noted in stance phase of gait) and turns.  Pt needs frequent verbal and demonstrative cues for turning practice with gait.  Will need continued work on weighsthifting and balance activities, as pt has narrow base of support with transfers and gait.   Pt will benefit from skilled therapeutic intervention in order to improve on the following deficits Abnormal gait;Decreased balance;Decreased mobility;Decreased knowledge of use of DME;Decreased safety awareness;Decreased strength;Difficulty walking;Postural dysfunction   Rehab Potential Good   Clinical Impairments  Affecting Rehab Potential Parkinson's dementia   PT Frequency 2x / week   PT Duration 8 weeks  wk 1 of 8   PT Treatment/Interventions ADLs/Self Care Home Management;DME Instruction;Gait training;Functional mobility training;Neuromuscular re-education;Balance training;Therapeutic exercise;Therapeutic activities;Patient/family education   PT Next Visit Plan use of machines for intensity, weighsthifting, balance and gait; sit<>stand transfers and turns   PT Home Exercise Plan sitting and standing PWR! Moves exercises, standing counter exercises, hamstring stretches   Consulted and Agree  with Plan of Care Patient;Family member/caregiver   Family Member Consulted daughter        Problem List Patient Active Problem List   Diagnosis Date Noted  . Proximal humerus fracture 10/27/2014  . Parkinson's disease 07/26/2013  . Parkinson's disease dementia 07/26/2013  . WEIGHT LOSS, ABNORMAL 09/13/2010  . ANXIETY 09/12/2010  . PARKINSON'S DISEASE 09/12/2010  . CONSTIPATION 09/12/2010  . BACK PAIN, LUMBOSACRAL, CHRONIC 09/12/2010  . NAUSEA WITH VOMITING 09/12/2010  . PERSONAL HISTORY OF COLONIC POLYPS 09/12/2010    Deny Chevez W. 01/25/2015, 9:25 AM  Lonia BloodAmy Nicolemarie Wooley, PT 01/25/2015 9:25 AM Phone: [phone] Fax: [phone]   [facility] [address] [place], [place], [address] Phone: [phone]   Fax:  [phone]

## 2015-01-26 ENCOUNTER — Ambulatory Visit: Payer: Medicare Other

## 2015-01-26 DIAGNOSIS — R269 Unspecified abnormalities of gait and mobility: Secondary | ICD-10-CM | POA: Diagnosis not present

## 2015-01-26 DIAGNOSIS — R279 Unspecified lack of coordination: Secondary | ICD-10-CM

## 2015-01-26 DIAGNOSIS — R258 Other abnormal involuntary movements: Secondary | ICD-10-CM

## 2015-01-26 DIAGNOSIS — R296 Repeated falls: Secondary | ICD-10-CM

## 2015-01-26 NOTE — Therapy (Signed)
[facility] [address] [place], [place], [address] Phone: [phone]   Fax:  [phone]  Physical Therapy Treatment  Patient Details  Name: Heather Mora MRN: [id number] Date of Birth: Jun 30, 1930 Referring Provider:  Barbie BannerWilson, Fred H, MD  Encounter Date: 01/26/2015      PT End of Session - 01/26/15 1656    Visit Number 3   Number of Visits 17   Date for PT Re-Evaluation 03/19/15   Authorization Type Medicare G-code every 10th visit   PT Start Time 1533   PT Stop Time 1620   PT Time Calculation (min) 47 min   Equipment Utilized During Treatment Gait belt      Past Medical History  Diagnosis Date  . Movement disorder   . Memory loss   . Parkinson disease   . Anxiety   . Dementia   . Inguinal hernia   . Arthritis   . Constipation     Past Surgical History  Procedure Laterality Date  . Hernia repair    . Back surgery    . Eye surgery Bilateral     cataract surgery with lens implant  . Eye surgery      laser surgery  . Colonoscopy    . Orif humerus fracture Left 10/27/2014    Procedure: OPEN REDUCTION INTERNAL FIXATION (ORIF) LEFT PROXIMAL HUMERUS FRACTURE;  Surgeon: Cheral AlmasNaiping Michael Xu, MD;  Location: [facility] OR;  Service: Orthopedics;  Laterality: Left;    There were no vitals taken for this visit.  Visit Diagnosis:  Abnormality of gait  Bradykinesia  Falls frequently  Lack of coordination      Subjective Assessment - 01/26/15 1536    Symptoms Feeling tired today due to doing a lot of exercise this morning. Pt reports she was going up and down the stairs for exercise.   Currently in Pain? Yes   Pain Score 7    Pain Location Arm   Pain Orientation Left   Pain Descriptors / Indicators Dull   Pain Type Chronic pain          PWR High Floor, trunk rotation and weight shift exercises modifed to countertop performed with mirroring and step by step verbal cues.   Lateral weight shift with  large amplitude reaching overhead at cabinet left and right, then with crossover midline reach left and right to promote increased trunk rotation.  Four square step training with CGA for forward and lateral each side, but MOD A to steady with retro step. Performed retro stepping over 2"x4" obstacle in parallel bars with and without UE support with step by step verbal cues and therapist concurrent demo. Improved weight shift noted.  High knee marching with reciprocal large amplitude overhead reaching with MOD A for coordinating/sequending and MOD A to steady as pt demonstrates excessive retro lean. Performed again in parallel bars with single UE support and tactile dues to decrease retro lean and improved performance noted.   Use of nu-step for intensity with patient educated on the benefit of performing endurance exercises with all extremities at 10% greater than self selected pace. Pt verbalized understanding and completed 5 minutes with all extremities at level 4.0 and target of 53 steps per minute.                     PT Education - 01/25/15 0920    Education provided Yes   Education Details tips to reduce freezing, HEP-marching turns and seated hamstring stretches  Person(s) Educated Patient;Child(ren)   Methods Explanation;Demonstration;Handout   Comprehension Verbalized understanding;Returned demonstration;Need further instruction          PT Short Term Goals - 01/18/15 1304    PT SHORT TERM GOAL #1   Title Pt will perform HEP for improved transfers, balance, and gait, with family's assistance. (Target 02/17/15)   Time 4   Period Weeks   Status New   PT SHORT TERM GOAL #2   Title Pt will perform at least 8 of 10 reps of sit<>stand transfers with minimal UE support for improved transfer efficiency and safety.   Time 4   Period Weeks   Status New   PT SHORT TERM GOAL #3   Title improve Berg Balance score to at least 28/56 for decreased fall risk.   Time 4   Period  Weeks   Status New   PT SHORT TERM GOAL #4   Title improve TUG score to less than or equal to 35 seconds for decreased fall risk.   Time 4   Period Weeks   Status New           PT Long Term Goals - 01/18/15 1307    PT LONG TERM GOAL #1   Title Pt/family will verbalize understanding of fall prevention techniques within home environment. (Target 03/19/15)   Time 8   Period Weeks   Status New   PT LONG TERM GOAL #2   Title verbalize understanding of tips to reduce freezing episodes with gait and turns.   Time 8   Period Weeks   Status New   PT LONG TERM GOAL #3   Title improve Berg Balance score to at least 38/56 for decreased fall risk.   Time 8   Period Weeks   Status New   PT LONG TERM GOAL #4   Title improve TUG score to less than or equal to 25 seconds for decreased fall risk.   Time 8   Period Weeks   Status New   PT LONG TERM GOAL #5   Title Pt/family will verbalize understanding of continued community fitness upon D/C from PT.   Time 8   Period Weeks   Status New               Plan - 01/26/15 1656    Clinical Impression Statement Pt demonstrates significant improvement in performance of coordination tasks with mirroring and step-by-step concurrent instrucitons. Pt demonstrated few episodes of freezing today. Continue toward goals.   PT Next Visit Plan use of machines for intensity, sit to stand transfers and turns, large amplitude movement        Problem List Patient Active Problem List   Diagnosis Date Noted  . Proximal humerus fracture 10/27/2014  . Parkinson's disease 07/26/2013  . Parkinson's disease dementia 07/26/2013  . WEIGHT LOSS, ABNORMAL 09/13/2010  . ANXIETY 09/12/2010  . PARKINSON'S DISEASE 09/12/2010  . CONSTIPATION 09/12/2010  . BACK PAIN, LUMBOSACRAL, CHRONIC 09/12/2010  . NAUSEA WITH VOMITING 09/12/2010  . PERSONAL HISTORY OF COLONIC POLYPS 09/12/2010   Lamar Laundry, PT,DPT,NCS 01/26/2015 5:06 PM Phone  [phone] FAX [phone]          [facility] [address] [place], [place], [address] Phone: [phone]   Fax:  [phone]

## 2015-01-30 ENCOUNTER — Ambulatory Visit: Payer: Medicare Other | Admitting: Physical Therapy

## 2015-01-31 ENCOUNTER — Ambulatory Visit: Payer: Medicare Other | Admitting: Occupational Therapy

## 2015-01-31 DIAGNOSIS — M25612 Stiffness of left shoulder, not elsewhere classified: Secondary | ICD-10-CM

## 2015-01-31 DIAGNOSIS — R279 Unspecified lack of coordination: Secondary | ICD-10-CM

## 2015-01-31 DIAGNOSIS — R258 Other abnormal involuntary movements: Secondary | ICD-10-CM

## 2015-01-31 DIAGNOSIS — M6281 Muscle weakness (generalized): Secondary | ICD-10-CM

## 2015-01-31 NOTE — Therapy (Signed)
[facility] [address] [place], [place], [address] Phone: [phone]   Fax:  [phone]  Occupational Therapy Treatment  Patient Details  Name: Heather Mora MRN: [id number] Date of Birth: 28-Sep-1930 Referring Provider:  [name], MD  Encounter Date: 01/31/2015      OT End of Session - 01/31/15 1225    Visit Number 2  G2   Number of Visits 17   Date for OT Re-Evaluation 03/18/15   Authorization Type MCR - G code needed   Authorization Time Period 60 days   OT Start Time 1145   OT Stop Time 1230   OT Time Calculation (min) 45 min   Activity Tolerance Patient tolerated treatment well      Past Medical History  Diagnosis Date  . Movement disorder   . Memory loss   . Parkinson disease   . Anxiety   . Dementia   . Inguinal hernia   . Arthritis   . Constipation     Past Surgical History  Procedure Laterality Date  . Hernia repair    . Back surgery    . Eye surgery Bilateral     cataract surgery with lens implant  . Eye surgery      laser surgery  . Colonoscopy    . Orif humerus fracture Left 10/27/2014    Procedure: OPEN REDUCTION INTERNAL FIXATION (ORIF) LEFT PROXIMAL HUMERUS FRACTURE;  Surgeon: [name], MD;  Location: [facility] OR;  Service: Orthopedics;  Laterality: Left;    There were no vitals taken for this visit.  Visit Diagnosis:  Lack of coordination  Stiffness of shoulder joint, left  Generalized muscle weakness  Bradykinesia      Subjective Assessment - 01/31/15 1147    Pertinent History Parkinsons disease   Currently in Pain? Yes   Pain Score 5    Pain Location Arm   Pain Orientation Left   Pain Descriptors / Indicators Dull   Pain Type Chronic pain;Surgical pain   Pain Onset More than a month ago   Pain Frequency Intermittent   Aggravating Factors  movement greater than 90 degrees   Pain Relieving Factors rest, OTC meds                 OT  Treatments/Exercises (OP) - 01/31/15 1149    Shoulder Exercises: Supine   Other Supine Exercises Cane ex's issued: in shoulder flexion, abduction, ER/IR (elbows bent to side, and out at 90* sh. abd.), and chest press supine x 10 reps each. Cane ex in shoulder extension (standing) x 10 reps w/ supervision for safety.    Shoulder Exercises: ROM/Strengthening   UBE (Upper Arm Bike) x 8 min. Level 1 maintaining RPM > 30                OT Education - 01/31/15 1215    Education provided Yes   Education Details Cane HEP (supine: shoulder flexion, abduction, ER/IR; Standing: shoulder extension)   Person(s) Educated Patient   Methods Explanation;Demonstration;Handout   Comprehension Verbalized understanding;Need further instruction;Returned demonstration          OT Short Term Goals - 01/18/15 1349    OT SHORT TERM GOAL #1   Title Independent w/ initial HEP (DUE 02/16/15)   Time 4   Period Weeks   Status New   OT SHORT TERM GOAL #2   Title Lt grip strength to increase to 25 lbs or greater    Baseline 20 lbs (Rt =  35 lbs)   Time 4   Period Weeks   Status New   OT SHORT TERM GOAL #3   Title Lt shoulder to perform 90* flexion for functional mid level reaching activities    Baseline 70-75*   Time 4   Period Weeks   Status New   OT SHORT TERM GOAL #4   Title Pt to report pain less than or equal to 3/10 Lt shoulder w/ ROM ex's and functional reaching    Baseline 4-5/10    Time 4   Period Weeks   Status New           OT Long Term Goals - 01/18/15 1352    OT LONG TERM GOAL #1   Title Independent w/ updated HEP (due 03/18/15)   Time 8   Period Weeks   Status New   OT LONG TERM GOAL #2   Title Pt to improve coordination as evidenced by reducing speed on 9 hole peg test to 40 sec. or less    Baseline eval = 48.16 sec.    Time 8   Period Weeks   Status New   OT LONG TERM GOAL #3   Title Pt to perfom functional reaching at 100* Lt shoulder flexion    Baseline eval =  70-75*   Time 8   Period Weeks   Status New   OT LONG TERM GOAL #4   Title Pt to perform all bathing/dressing Mod I level safely    Baseline min assist   Time 8   Period Weeks   Status New               Plan - 01/31/15 1226    Clinical Impression Statement Pt tolerating cane exercises supine for HEP but requires mod cueing to perform and will need review of HEP d/t cognitive deficits.    Plan review cane HEP, issue putty HEP for Lt grip strength, PWR moves for Parkinsons or any strategies for Parkinson's   Consulted and Agree with Plan of Care Patient        Problem List Patient Active Problem List   Diagnosis Date Noted  . Proximal humerus fracture 10/27/2014  . Parkinson's disease 07/26/2013  . Parkinson's disease dementia 07/26/2013  . WEIGHT LOSS, ABNORMAL 09/13/2010  . ANXIETY 09/12/2010  . PARKINSON'S DISEASE 09/12/2010  . CONSTIPATION 09/12/2010  . BACK PAIN, LUMBOSACRAL, CHRONIC 09/12/2010  . NAUSEA WITH VOMITING 09/12/2010  . PERSONAL HISTORY OF COLONIC POLYPS 09/12/2010    Kelli ChurnBallie, Lisanne Ponce Johnson, OTR/L 01/31/2015, 12:30 PM  [facility] [address] [place], [place], [address] Phone: [phone]   Fax:  [phone]

## 2015-01-31 NOTE — Patient Instructions (Signed)
ROM: External Rotation - Wand (Supine)   Lie on back holding wand with elbows bent to 90. Rotate forearms over head as far as possible, then keeping shoulders and elbows out to side, move cane to belly button. .  Repeat __10__ times per set. Do __1__ sets per session. Do __3__ sessions per day.     Lie on back holding wand. Raise straight arms over head. Hold 5sec. Repeat 10 times per set.  Do 2-3 sessions per day.   ROM: Abduction - Wand   Holding wand with left hand palm up, push wand directly out to left side, leading with other hand palm down, until stretch is felt. Hold 5 seconds. Repeat 10 times per set. Do 2-3 sessions per day. (Lying down)   ROM: Extension - Wand (Standing)   Stand holding wand palms forward behind back. Raise arms as far as possible. Repeat 10 times per set.  Do 2-3 sessions per day.   Press-Up With Wand   Press wand up until elbows are straight, then reach wand over head to a pain free range. Hold 5 seconds. Repeat 10 times. Do 2-3 sessions per day.      Copyright  VHI. All rights reserved.

## 2015-02-01 ENCOUNTER — Ambulatory Visit: Payer: Medicare Other

## 2015-02-01 DIAGNOSIS — R269 Unspecified abnormalities of gait and mobility: Secondary | ICD-10-CM

## 2015-02-01 DIAGNOSIS — R258 Other abnormal involuntary movements: Secondary | ICD-10-CM

## 2015-02-01 DIAGNOSIS — R296 Repeated falls: Secondary | ICD-10-CM

## 2015-02-01 DIAGNOSIS — M6281 Muscle weakness (generalized): Secondary | ICD-10-CM

## 2015-02-01 DIAGNOSIS — R279 Unspecified lack of coordination: Secondary | ICD-10-CM

## 2015-02-01 NOTE — Therapy (Signed)
[facility] [address] [place], [place], [address] Phone: [phone]   Fax:  [phone]  Physical Therapy Treatment  Patient Details  Name: Heather Mora MRN: [id number] Date of Birth: 1930/06/04 Referring Provider:  Barbie BannerWilson, Fred H, MD  Encounter Date: 02/01/2015    Past Medical History  Diagnosis Date  . Movement disorder   . Memory loss   . Parkinson disease   . Anxiety   . Dementia   . Inguinal hernia   . Arthritis   . Constipation     Past Surgical History  Procedure Laterality Date  . Hernia repair    . Back surgery    . Eye surgery Bilateral     cataract surgery with lens implant  . Eye surgery      laser surgery  . Colonoscopy    . Orif humerus fracture Left 10/27/2014    Procedure: OPEN REDUCTION INTERNAL FIXATION (ORIF) LEFT PROXIMAL HUMERUS FRACTURE;  Surgeon: Cheral AlmasNaiping Michael Xu, MD;  Location: [facility] OR;  Service: Orthopedics;  Laterality: Left;    There were no vitals taken for this visit.  Visit Diagnosis:  No diagnosis found.      Subjective Assessment - 02/01/15 1535    Symptoms Feeling tired today especially tired in the arms. Believes this is form using the arm bike yesterday.   Currently in Pain? No/denies      10x sit <> stand without UE support with verbal cues and demo for sequencing.  Stepping over 2"x4" obstacle forward/backward and lateral  4 square drill without UE support with supervision repeated trials in each direction  4 square drill with the progression of 90 degree turns with each square   Turning 360 degrees with BUE support and extra time required with pt unable to coordinate task today.                       PT Short Term Goals - 01/18/15 1304    PT SHORT TERM GOAL #1   Title Pt will perform HEP for improved transfers, balance, and gait, with family's assistance. (Target 02/17/15)   Time 4   Period Weeks   Status New   PT SHORT TERM  GOAL #2   Title Pt will perform at least 8 of 10 reps of sit<>stand transfers with minimal UE support for improved transfer efficiency and safety.   Time 4   Period Weeks   Status New   PT SHORT TERM GOAL #3   Title improve Berg Balance score to at least 28/56 for decreased fall risk.   Time 4   Period Weeks   Status New   PT SHORT TERM GOAL #4   Title improve TUG score to less than or equal to 35 seconds for decreased fall risk.   Time 4   Period Weeks   Status New           PT Long Term Goals - 01/18/15 1307    PT LONG TERM GOAL #1   Title Pt/family will verbalize understanding of fall prevention techniques within home environment. (Target 03/19/15)   Time 8   Period Weeks   Status New   PT LONG TERM GOAL #2   Title verbalize understanding of tips to reduce freezing episodes with gait and turns.   Time 8   Period Weeks   Status New   PT LONG TERM GOAL #3   Title improve Berg Balance score to at least 38/56 for  decreased fall risk.   Time 8   Period Weeks   Status New   PT LONG TERM GOAL #4   Title improve TUG score to less than or equal to 25 seconds for decreased fall risk.   Time 8   Period Weeks   Status New   PT LONG TERM GOAL #5   Title Pt/family will verbalize understanding of continued community fitness upon D/C from PT.   Time 8   Period Weeks   Status New               Problem List Patient Active Problem List   Diagnosis Date Noted  . Proximal humerus fracture 10/27/2014  . Parkinson's disease 07/26/2013  . Parkinson's disease dementia 07/26/2013  . WEIGHT LOSS, ABNORMAL 09/13/2010  . ANXIETY 09/12/2010  . PARKINSON'S DISEASE 09/12/2010  . CONSTIPATION 09/12/2010  . BACK PAIN, LUMBOSACRAL, CHRONIC 09/12/2010  . NAUSEA WITH VOMITING 09/12/2010  . PERSONAL HISTORY OF COLONIC POLYPS 09/12/2010    Lamar Laundry D 02/01/2015, 3:43 PM  [facility] [address] [place], [place], [address] Phone: [phone]   Fax:  [phone]

## 2015-02-06 ENCOUNTER — Ambulatory Visit: Payer: Medicare Other | Admitting: Physical Therapy

## 2015-02-06 ENCOUNTER — Ambulatory Visit: Payer: Medicare Other | Admitting: Occupational Therapy

## 2015-02-06 ENCOUNTER — Encounter: Payer: Self-pay | Admitting: Occupational Therapy

## 2015-02-06 DIAGNOSIS — M6281 Muscle weakness (generalized): Secondary | ICD-10-CM

## 2015-02-06 DIAGNOSIS — R269 Unspecified abnormalities of gait and mobility: Secondary | ICD-10-CM | POA: Diagnosis not present

## 2015-02-06 DIAGNOSIS — M25612 Stiffness of left shoulder, not elsewhere classified: Secondary | ICD-10-CM | POA: Diagnosis not present

## 2015-02-06 DIAGNOSIS — R258 Other abnormal involuntary movements: Secondary | ICD-10-CM

## 2015-02-06 NOTE — Therapy (Signed)
[facility] [address] [place], [place], [address] Phone: [phone]   Fax:  [phone]  Physical Therapy Treatment  Patient Details  Name: Heather Mora MRN: [id number] Date of Birth: [birth year] Referring Provider:  Barbie Banner, MD  Encounter Date: 02/06/2015      PT End of Session - 02/06/15 1510    Visit Number 5   Number of Visits 17   Date for PT Re-Evaluation 03/19/15   Authorization Type Medicare G-code every 10th visit   PT Start Time 1404   PT Stop Time 1445   PT Time Calculation (min) 41 min   Equipment Utilized During Treatment Gait belt   Activity Tolerance Patient tolerated treatment well      Past Medical History  Diagnosis Date  . Movement disorder   . Memory loss   . Parkinson disease   . Anxiety   . Dementia   . Inguinal hernia   . Arthritis   . Constipation     Past Surgical History  Procedure Laterality Date  . Hernia repair    . Back surgery    . Eye surgery Bilateral     cataract surgery with lens implant  . Eye surgery      laser surgery  . Colonoscopy    . Orif humerus fracture Left 10/27/2014    Procedure: OPEN REDUCTION INTERNAL FIXATION (ORIF) LEFT PROXIMAL HUMERUS FRACTURE;  Surgeon: [name], MD;  Location: [facility] OR;  Service: Orthopedics;  Laterality: Left;    There were no vitals taken for this visit.  Visit Diagnosis:  Abnormality of gait      Subjective Assessment - 02/06/15 1405    Symptoms Feeling a little stiff today.   Currently in Pain? No/denies                    [facility] Adult PT Treatment/Exercise - 02/06/15 1417    Ambulation/Gait   Ambulation/Gait Yes   Ambulation/Gait Assistance 4: Min guard   Ambulation/Gait Assistance Details Narrowed base of support with gait; with focused attention, pt has decreased freezing episodes   Ambulation Distance (Feet) 600 Feet   Assistive device None   Gait Pattern --  freezing  episodes with narrow spaces   Gait Comments Gait activities change of directions, incorporating turns (marching) and negotiating small spaces.  With small spaces, pt does have freezing episodes, with min assist and cues to decrease freezing.   High Level Balance   High Level Balance Activities Backward walking;Side stepping  in parallel bars, 3 reps x 10 ft each direction   High Level Balance Comments Side step and weightshift x 10, forward step and weightshift x 10, then back step and weightshift in parallel bars with bilateral UE support;       In parallel bars simulating counter:  Marching in place x 10 reps, forward step and weightshift x 10 reps, side step and weight shift x 10 reps, back step and weightshift x 10 reps with UE support.  Alternating step taps at 6 inch and 12 inch step with bilateral UE support x 10 reps each; step ups forward x 10 reps, then side step ups x 10 reps for dynamic balance. In parallel bars, practiced quarter turns to R and to L 2 times each, with max verbal and tactile cues.  Seated postural stability exercises:  Anterior pelvic tilts, with forward lean then return to sit tall x 10 reps; sit tall with best posture with shoulder  blade squeezes x 10 reps, best sitting posture with ball raises and cues to keep posture tall and prevent posterior lean.            PT Short Term Goals - 01/18/15 1304    PT SHORT TERM GOAL #1   Title Pt will perform HEP for improved transfers, balance, and gait, with family's assistance. (Target 02/17/15)   Time 4   Period Weeks   Status New   PT SHORT TERM GOAL #2   Title Pt will perform at least 8 of 10 reps of sit<>stand transfers with minimal UE support for improved transfer efficiency and safety.   Time 4   Period Weeks   Status New   PT SHORT TERM GOAL #3   Title improve Berg Balance score to at least 28/56 for decreased fall risk.   Time 4   Period Weeks   Status New   PT SHORT TERM GOAL #4   Title improve TUG  score to less than or equal to 35 seconds for decreased fall risk.   Time 4   Period Weeks   Status New           PT Long Term Goals - 01/18/15 1307    PT LONG TERM GOAL #1   Title Pt/family will verbalize understanding of fall prevention techniques within home environment. (Target 03/19/15)   Time 8   Period Weeks   Status New   PT LONG TERM GOAL #2   Title verbalize understanding of tips to reduce freezing episodes with gait and turns.   Time 8   Period Weeks   Status New   PT LONG TERM GOAL #3   Title improve Berg Balance score to at least 38/56 for decreased fall risk.   Time 8   Period Weeks   Status New   PT LONG TERM GOAL #4   Title improve TUG score to less than or equal to 25 seconds for decreased fall risk.   Time 8   Period Weeks   Status New   PT LONG TERM GOAL #5   Title Pt/family will verbalize understanding of continued community fitness upon D/C from PT.   Time 8   Period Weeks   Status New               Plan - 02/06/15 1510    Clinical Impression Statement Pt needs frequent redirection during turning practice activities in parallel bars.  In gym area with walking and turning activities, with cues, pt is able to perform marching turns.     Pt will benefit from skilled therapeutic intervention in order to improve on the following deficits Abnormal gait;Decreased balance;Decreased mobility;Decreased knowledge of use of DME;Decreased safety awareness;Decreased strength;Difficulty walking;Postural dysfunction   Rehab Potential Good   Clinical Impairments Affecting Rehab Potential Parkinson's dementia   PT Frequency 2x / week   PT Duration 8 weeks  wk 3 of 8   PT Treatment/Interventions ADLs/Self Care Home Management;DME Instruction;Gait training;Functional mobility training;Neuromuscular re-education;Balance training;Therapeutic exercise;Therapeutic activities;Patient/family education   PT Next Visit Plan Turning training; core stability/posture  training; try to add step and weightshift to HEP   Consulted and Agree with Plan of Care Patient        Problem List Patient Active Problem List   Diagnosis Date Noted  . Proximal humerus fracture 10/27/2014  . Parkinson's disease 07/26/2013  . Parkinson's disease dementia 07/26/2013  . WEIGHT LOSS, ABNORMAL 09/13/2010  . ANXIETY 09/12/2010  . PARKINSON'S DISEASE 09/12/2010  .  CONSTIPATION 09/12/2010  . BACK PAIN, LUMBOSACRAL, CHRONIC 09/12/2010  . NAUSEA WITH VOMITING 09/12/2010  . PERSONAL HISTORY OF COLONIC POLYPS 09/12/2010    Ramsay Bognar W. 02/06/2015, 3:15 PM  Lonia BloodAmy Jorden Minchey, PT 02/06/2015 3:16 PM Phone: [phone] Fax: [phone]   [facility] [address] [place], [place], [address] Phone: [phone]   Fax:  [phone]

## 2015-02-06 NOTE — Therapy (Signed)
[facility] [address] [place], [place], [address] Phone: [phone]   Fax:  [phone]  Occupational Therapy Treatment  Patient Details  Name: Heather MalloryBernadette Besson MRN: [id number] Date of Birth: [birth year] Referring Provider:  Cheral AlmasXu, Naiping Michael, MD  Encounter Date: 02/06/2015      OT End of Session - 02/06/15 1530    Visit Number 3   Number of Visits 17   Date for OT Re-Evaluation 03/18/15   Authorization Type MCR - G code needed   Authorization Time Period 60 days   OT Start Time 1318   OT Stop Time 1400   OT Time Calculation (min) 42 min   Activity Tolerance Patient tolerated treatment well      Past Medical History  Diagnosis Date  . Movement disorder   . Memory loss   . Parkinson disease   . Anxiety   . Dementia   . Inguinal hernia   . Arthritis   . Constipation     Past Surgical History  Procedure Laterality Date  . Hernia repair    . Back surgery    . Eye surgery Bilateral     cataract surgery with lens implant  . Eye surgery      laser surgery  . Colonoscopy    . Orif humerus fracture Left 10/27/2014    Procedure: OPEN REDUCTION INTERNAL FIXATION (ORIF) LEFT PROXIMAL HUMERUS FRACTURE;  Surgeon: Cheral AlmasNaiping Michael Xu, MD;  Location: [facility] OR;  Service: Orthopedics;  Laterality: Left;    There were no vitals taken for this visit.  Visit Diagnosis:  Stiffness of shoulder joint, left  Generalized muscle weakness  Bradykinesia      Subjective Assessment - 02/06/15 1334    Symptoms "It hurts when I slide up the wall"   Currently in Pain? Yes   Pain Score 5    Pain Location Arm   Pain Orientation Left   Aggravating Factors  exercise   Pain Relieving Factors rest, OTC meds                 OT Treatments/Exercises (OP) - 02/06/15 0001    ADLs   Functional Mobility Sit>stand with mod cues for big movements/effort and adaptive strategies.  Also needed min-mod cues for posture in  standing due to dyskinesias/decreased awareness.   Neurological Re-education Exercises   Other Exercises 1 Sitting, PWR! up x12, Supine PWR! up x12 with mod verbal and tactile cues                OT Education - 02/06/15 1335    Education Details Cane HEP    Person(s) Educated Patient   Methods Explanation;Demonstration;Verbal cues;Tactile cues   Comprehension Verbalized understanding;Returned demonstration;Verbal cues required;Tactile cues required;Need further instruction          OT Short Term Goals - 01/18/15 1349    OT SHORT TERM GOAL #1   Title Independent w/ initial HEP (DUE 02/16/15)   Time 4   Period Weeks   Status New   OT SHORT TERM GOAL #2   Title Lt grip strength to increase to 25 lbs or greater    Baseline 20 lbs (Rt = 35 lbs)   Time 4   Period Weeks   Status New   OT SHORT TERM GOAL #3   Title Lt shoulder to perform 90* flexion for functional mid level reaching activities    Baseline 70-75*   Time 4   Period Weeks   Status New  OT SHORT TERM GOAL #4   Title Pt to report pain less than or equal to 3/10 Lt shoulder w/ ROM ex's and functional reaching    Baseline 4-5/10    Time 4   Period Weeks   Status New           OT Long Term Goals - 01/18/15 1352    OT LONG TERM GOAL #1   Title Independent w/ updated HEP (due 03/18/15)   Time 8   Period Weeks   Status New   OT LONG TERM GOAL #2   Title Pt to improve coordination as evidenced by reducing speed on 9 hole peg test to 40 sec. or less    Baseline eval = 48.16 sec.    Time 8   Period Weeks   Status New   OT LONG TERM GOAL #3   Title Pt to perfom functional reaching at 100* Lt shoulder flexion    Baseline eval = 70-75*   Time 8   Period Weeks   Status New   OT LONG TERM GOAL #4   Title Pt to perform all bathing/dressing Mod I level safely    Baseline min assist   Time 8   Period Weeks   Status New               Plan - 02/06/15 1720    Clinical Impression Statement Pt will  need continued reinforcement for HEP due to cognitive deficits.  Dyskinesias/abnormal posturing with bradykinesia increases fall risk.   Plan reinforce cane HEP, ?putty HEP for L grip strength (include finger extension)        Problem List Patient Active Problem List   Diagnosis Date Noted  . Proximal humerus fracture 10/27/2014  . Parkinson's disease 07/26/2013  . Parkinson's disease dementia 07/26/2013  . WEIGHT LOSS, ABNORMAL 09/13/2010  . ANXIETY 09/12/2010  . PARKINSON'S DISEASE 09/12/2010  . CONSTIPATION 09/12/2010  . BACK PAIN, LUMBOSACRAL, CHRONIC 09/12/2010  . NAUSEA WITH VOMITING 09/12/2010  . PERSONAL HISTORY OF COLONIC POLYPS 09/12/2010    Willa Frater, OTR/L 02/06/2015, 5:24 PM  [facility] [address] [place], [place], [address] Phone: [phone]   Fax:  [phone]

## 2015-02-10 ENCOUNTER — Ambulatory Visit: Payer: Medicare Other | Admitting: Physical Therapy

## 2015-02-10 DIAGNOSIS — R269 Unspecified abnormalities of gait and mobility: Secondary | ICD-10-CM

## 2015-02-10 DIAGNOSIS — M6281 Muscle weakness (generalized): Secondary | ICD-10-CM

## 2015-02-10 NOTE — Patient Instructions (Signed)
Side Stepping Lunge   Standing at the counter, step out to theside, bending hip and knee to 90. Keep other leg straight. Return to starting position. Perform _10__ steps to the right. Perform _10__ steps to the left.  Copyright  VHI. All rights reserved.  Side-Stepping   At the counter, walk to left side with eyes open. Take even steps, leading with same foot. Make sure each foot lifts off the floor. Repeat in opposite direction. Repeat for _10 ft___ or along the length of the counterrop. Do __5__ repetitions to the right and to the left.   Copyright  VHI. All rights reserved.  Forward Lunge step   Stand beside the counter, with feet shoulder width apart and stomach tight, step forward with left leg.  Return left leg to starting position Repeat _10___ times per set, then repeat 10 times with right leg. Do __1-2__ sessions per day.  http://orth.exer.us/1147   Copyright  VHI. All rights reserved.  FUNCTIONAL MOBILITY: Marching - Standing   March in place by lifting left leg up, then right. Alternate your legs with high marching step, with a brief hold in the air. _10__ reps per set, _2__ sets per time. Hold onto a support.  Copyright  VHI. All rights reserved.

## 2015-02-10 NOTE — Therapy (Signed)
[facility] [address] [place], [place], [address] Phone: [phone]   Fax:  [phone]  Physical Therapy Treatment  Patient Details  Name: Heather Mora MRN: [id number] Date of Birth: 05-01-1930 Referring Provider:  Barbie Banner, MD  Encounter Date: Oct 24, 202016      PT End of Session - 02/10/15 1214    Visit Number 6   Number of Visits 17   Date for PT Re-Evaluation 03/19/15   Authorization Type Medicare G-code every 10th visit   PT Start Time 1018   PT Stop Time 1102   PT Time Calculation (min) 44 min   Equipment Utilized During Treatment Gait belt   Activity Tolerance Patient tolerated treatment well   Behavior During Therapy Anxious  at end of session; BP 167/94      Past Medical History  Diagnosis Date  . Movement disorder   . Memory loss   . Parkinson disease   . Anxiety   . Dementia   . Inguinal hernia   . Arthritis   . Constipation     Past Surgical History  Procedure Laterality Date  . Hernia repair    . Back surgery    . Eye surgery Bilateral     cataract surgery with lens implant  . Eye surgery      laser surgery  . Colonoscopy    . Orif humerus fracture Left 10/27/2014    Procedure: OPEN REDUCTION INTERNAL FIXATION (ORIF) LEFT PROXIMAL HUMERUS FRACTURE;  Surgeon: [name], MD;  Location: [facility] OR;  Service: Orthopedics;  Laterality: Left;    There were no vitals taken for this visit.  Visit Diagnosis:  Abnormality of gait  Generalized muscle weakness      Subjective Assessment - 02/10/15 1022    Symptoms Feeling that I am having a little more shakiness today; took my medications   Currently in Pain? No/denies                    [facility] Adult PT Treatment/Exercise - 02/10/15 0001    Transfers   Transfers Sit to Stand;Stand to Sit   Sit to Stand 7: Independent;Without upper extremity assist;From bed;From chair/3-in-1  10 reps each with cues for full  knee extension   Sit to Stand Details (indicate cue type and reason) cues for increased forward lean   Stand to Sit 7: Independent;To bed;To chair/3-in-1;Without upper extremity assist   High Level Balance   High Level Balance Activities Side stepping;Backward walking  10 ft x 4 reps; marching in place x 10 reps   High Level Balance Comments Side step and weightshift x 10, forward step and weightshift x 10, then back step and weightshift at counter with bilateral UE support;    Exercises   Exercises Knee/Hip   Knee/Hip Exercises: Aerobic   Stationary Bike NuStep, Level 3, 4 extremities x 8 minutes, at mid-50's steps/minute      Prior to attempting to perform obstacle negotiation, pt reports not feeling herself today-an anxious episode.  BP checked, with seated BP 167/94 and standing BP 158/83.  Daughter notified at end of session and she reports sometimes she gets more anxious.  Pt has no headache/blurred vision/accompanying symptoms.          PT Education - 02/10/15 1213    Education provided Yes   Education Details HEP-balance exercises   Person(s) Educated Patient;Child(ren)   Methods Explanation;Demonstration;Verbal cues;Handout   Comprehension Verbalized understanding;Returned demonstration;Verbal cues required;Need further instruction  PT Short Term Goals - 01/18/15 1304    PT SHORT TERM GOAL #1   Title Pt will perform HEP for improved transfers, balance, and gait, with family's assistance. (Target 02/17/15)   Time 4   Period Weeks   Status New   PT SHORT TERM GOAL #2   Title Pt will perform at least 8 of 10 reps of sit<>stand transfers with minimal UE support for improved transfer efficiency and safety.   Time 4   Period Weeks   Status New   PT SHORT TERM GOAL #3   Title improve Berg Balance score to at least 28/56 for decreased fall risk.   Time 4   Period Weeks   Status New   PT SHORT TERM GOAL #4   Title improve TUG score to less than or equal to 35  seconds for decreased fall risk.   Time 4   Period Weeks   Status New           PT Long Term Goals - 01/18/15 1307    PT LONG TERM GOAL #1   Title Pt/family will verbalize understanding of fall prevention techniques within home environment. (Target 03/19/15)   Time 8   Period Weeks   Status New   PT LONG TERM GOAL #2   Title verbalize understanding of tips to reduce freezing episodes with gait and turns.   Time 8   Period Weeks   Status New   PT LONG TERM GOAL #3   Title improve Berg Balance score to at least 38/56 for decreased fall risk.   Time 8   Period Weeks   Status New   PT LONG TERM GOAL #4   Title improve TUG score to less than or equal to 25 seconds for decreased fall risk.   Time 8   Period Weeks   Status New   PT LONG TERM GOAL #5   Title Pt/family will verbalize understanding of continued community fitness upon D/C from PT.   Time 8   Period Weeks   Status New               Plan - 02/10/15 1215    Clinical Impression Statement Pt able to sequence exercises at counter well with verbal and demonstrative cues today.  Pt more fearful in posterior direction; pt with increased anxiousness with attempts at obstacles.   Pt will benefit from skilled therapeutic intervention in order to improve on the following deficits Abnormal gait;Decreased balance;Decreased mobility;Decreased knowledge of use of DME;Decreased safety awareness;Decreased strength;Difficulty walking;Postural dysfunction   Rehab Potential Good   PT Frequency 2x / week   PT Duration 8 weeks  wk 3 of 8   PT Treatment/Interventions ADLs/Self Care Home Management;DME Instruction;Gait training;Functional mobility training;Neuromuscular re-education;Balance training;Therapeutic exercise;Therapeutic activities;Patient/family education   PT Next Visit Plan obstacle negotiation, turns, review HEP; check STGs   Consulted and Agree with Plan of Care Patient;Family member/caregiver   Family Member  Consulted daughter        Problem List Patient Active Problem List   Diagnosis Date Noted  . Proximal humerus fracture 10/27/2014  . Parkinson's disease 07/26/2013  . Parkinson's disease dementia 07/26/2013  . WEIGHT LOSS, ABNORMAL 09/13/2010  . ANXIETY 09/12/2010  . PARKINSON'S DISEASE 09/12/2010  . CONSTIPATION 09/12/2010  . BACK PAIN, LUMBOSACRAL, CHRONIC 09/12/2010  . NAUSEA WITH VOMITING 09/12/2010  . PERSONAL HISTORY OF COLONIC POLYPS 09/12/2010    Kalisha Keadle W. 10/11/2015, 12:19 PM  Trinidy Masterson, PT 010/26/2016 12:21 PM Phone: [phone]  Fax: [phone]   [facility] [address] [place], [place], [address] Phone: [phone]   Fax:  [phone]

## 2015-02-13 ENCOUNTER — Ambulatory Visit: Payer: Medicare Other | Admitting: Physical Therapy

## 2015-02-13 ENCOUNTER — Encounter: Payer: Medicare Other | Admitting: Occupational Therapy

## 2015-02-14 ENCOUNTER — Ambulatory Visit: Payer: Medicare Other | Attending: Family Medicine | Admitting: Physical Therapy

## 2015-02-14 ENCOUNTER — Ambulatory Visit: Payer: Medicare Other | Attending: Orthopaedic Surgery | Admitting: Occupational Therapy

## 2015-02-14 DIAGNOSIS — G20 Parkinson's disease: Secondary | ICD-10-CM | POA: Diagnosis not present

## 2015-02-14 DIAGNOSIS — F0280 Dementia in other diseases classified elsewhere without behavioral disturbance: Secondary | ICD-10-CM | POA: Insufficient documentation

## 2015-02-14 DIAGNOSIS — R279 Unspecified lack of coordination: Secondary | ICD-10-CM | POA: Diagnosis not present

## 2015-02-14 DIAGNOSIS — M25612 Stiffness of left shoulder, not elsewhere classified: Secondary | ICD-10-CM

## 2015-02-14 DIAGNOSIS — M6281 Muscle weakness (generalized): Secondary | ICD-10-CM

## 2015-02-14 DIAGNOSIS — Z9181 History of falling: Secondary | ICD-10-CM | POA: Insufficient documentation

## 2015-02-14 DIAGNOSIS — R258 Other abnormal involuntary movements: Secondary | ICD-10-CM

## 2015-02-14 DIAGNOSIS — R29898 Other symptoms and signs involving the musculoskeletal system: Secondary | ICD-10-CM

## 2015-02-14 DIAGNOSIS — R269 Unspecified abnormalities of gait and mobility: Secondary | ICD-10-CM | POA: Diagnosis present

## 2015-02-14 NOTE — Patient Instructions (Signed)
For turning to sit:  Try to use cues for a large walking turns (like a U-turn or a half circle), taking big steps to turn to sit.  This has worked well as cues during the PT session today.  The cues to walk around in a U, taking side steps or one to two backward steps to find the chair and have a seat.  Also, practicing figure-8 turns and U-turns for walking and taking larger steps for turns would be helpful.

## 2015-02-14 NOTE — Therapy (Signed)
[facility] [address] [place], [place], [address] Phone: [phone]   Fax:  [phone]  Occupational Therapy Treatment  Patient Details  Name: Heather Mora MRN: [id number] Date of Birth: 1930-04-16 Referring Provider:  Cheral AlmasXu, Naiping Michael, MD  Encounter Date: 02/14/2015      OT End of Session - 02/14/15 1256    Visit Number 4   Number of Visits 17   Date for OT Re-Evaluation 03/18/15   Authorization Type MCR - G code needed   Authorization Time Period 60 days   Authorization - Visit Number 4   Authorization - Number of Visits 10   OT Start Time 1148   OT Stop Time 1230   OT Time Calculation (min) 42 min   Activity Tolerance Patient tolerated treatment well      Past Medical History  Diagnosis Date  . Movement disorder   . Memory loss   . Parkinson disease   . Anxiety   . Dementia   . Inguinal hernia   . Arthritis   . Constipation     Past Surgical History  Procedure Laterality Date  . Hernia repair    . Back surgery    . Eye surgery Bilateral     cataract surgery with lens implant  . Eye surgery      laser surgery  . Colonoscopy    . Orif humerus fracture Left 10/27/2014    Procedure: OPEN REDUCTION INTERNAL FIXATION (ORIF) LEFT PROXIMAL HUMERUS FRACTURE;  Surgeon: Cheral AlmasNaiping Michael Xu, MD;  Location: [facility] OR;  Service: Orthopedics;  Laterality: Left;    There were no vitals taken for this visit.  Visit Diagnosis:  Stiffness of shoulder joint, left  Bradykinesia  Rigidity      Subjective Assessment - 02/14/15 1152    Symptoms It hurts when I raise it   Pain Score 7   none at rest   Pain Location Arm   Pain Orientation Left   Aggravating Factors  exercise   Pain Relieving Factors rest                 OT Treatments/Exercises (OP) - 02/14/15 0001    Neurological Re-education Exercises   Other Exercises 1 Sitting, PWR! up and rock with wt. bearing on L hand with min-mod  verbal/tactile cues.   Other Exercises 2 Supine, PWR! rock (modified with min-mod facilitation LUE), and up with min verbal/tactile cues.   Functional Reaching Activities   Mid Level low-mid range functional reaching with trunk rotation and wt. shift  reaching laterally and across body with min v.cues for big movements.                OT Education - 02/14/15 1155    Education Details HEP (cane)   Person(s) Educated Patient   Methods Explanation;Demonstration;Verbal cues   Comprehension Verbalized understanding;Verbal cues required;Returned demonstration          OT Short Term Goals - 02/14/15 1258    OT SHORT TERM GOAL #1   Title Independent w/ initial HEP (DUE 02/16/15)   Time 4   Period Weeks   Status Achieved  with cane HEP   OT SHORT TERM GOAL #2   Title Lt grip strength to increase to 25 lbs or greater    Baseline 20 lbs (Rt = 35 lbs)   Time 4   Period Weeks   Status On-going  ongoing due to only seen for 4 visits   OT SHORT TERM GOAL #  3   Title Lt shoulder to perform 90* flexion for functional mid level reaching activities    Baseline 70-75*   Time 4   Period Weeks   Status On-going  only seen 4 visits   OT SHORT TERM GOAL #4   Title Pt to report pain less than or equal to 3/10 Lt shoulder w/ ROM ex's and functional reaching    Baseline 4-5/10    Time 4   Period Weeks   Status On-going  5/10 on 02/14/15           OT Long Term Goals - 01/18/15 1352    OT LONG TERM GOAL #1   Title Independent w/ updated HEP (due 03/18/15)   Time 8   Period Weeks   Status New   OT LONG TERM GOAL #2   Title Pt to improve coordination as evidenced by reducing speed on 9 hole peg test to 40 sec. or less    Baseline eval = 48.16 sec.    Time 8   Period Weeks   Status New   OT LONG TERM GOAL #3   Title Pt to perfom functional reaching at 100* Lt shoulder flexion    Baseline eval = 70-75*   Time 8   Period Weeks   Status New   OT LONG TERM GOAL #4   Title Pt to  perform all bathing/dressing Mod I level safely    Baseline min assist   Time 8   Period Weeks   Status New               Plan - 02/14/15 1257    Clinical Impression Statement Pt demo improved ROM today and performed HEP with min v.c.   Plan putty HEP for L grip strength (including finger extension), continue unmet STGs as pt  only seen 4 visits, schedule more OT (only scheduled through 3/9)        Problem List Patient Active Problem List   Diagnosis Date Noted  . Proximal humerus fracture 10/27/2014  . Parkinson's disease 07/26/2013  . Parkinson's disease dementia 07/26/2013  . WEIGHT LOSS, ABNORMAL 09/13/2010  . ANXIETY 09/12/2010  . PARKINSON'S DISEASE 09/12/2010  . CONSTIPATION 09/12/2010  . BACK PAIN, LUMBOSACRAL, CHRONIC 09/12/2010  . NAUSEA WITH VOMITING 09/12/2010  . PERSONAL HISTORY OF COLONIC POLYPS 09/12/2010    Willa Frater, OTR/L 02/14/2015, 1:02 PM  [facility] [address] [place], [place], [address] Phone: [phone]   Fax:  [phone]

## 2015-02-15 ENCOUNTER — Ambulatory Visit: Payer: Medicare Other | Admitting: Occupational Therapy

## 2015-02-15 ENCOUNTER — Ambulatory Visit: Payer: Medicare Other | Admitting: Physical Therapy

## 2015-02-15 DIAGNOSIS — R269 Unspecified abnormalities of gait and mobility: Secondary | ICD-10-CM | POA: Diagnosis not present

## 2015-02-15 DIAGNOSIS — M25612 Stiffness of left shoulder, not elsewhere classified: Secondary | ICD-10-CM | POA: Diagnosis not present

## 2015-02-15 DIAGNOSIS — M6281 Muscle weakness (generalized): Secondary | ICD-10-CM

## 2015-02-15 NOTE — Therapy (Signed)
[facility] [address] [place], [place], [address] Phone: [phone]   Fax:  [phone]  Physical Therapy Treatment  Patient Details  Name: Heather Mora MRN: [id number] Date of Birth: 1930-03-26 Referring Provider:  Christain Sacramento, MD  Encounter Date: 02/15/2015      PT End of Session - 02/15/15 1639    Visit Number 8   Number of Visits 17   Date for PT Re-Evaluation 03/19/15   Authorization Type Medicare G-code every 10th visit   PT Start Time 1020   PT Stop Time 1100   PT Time Calculation (min) 40 min   Equipment Utilized During Treatment Gait belt   Activity Tolerance Patient tolerated treatment well      Past Medical History  Diagnosis Date  . Movement disorder   . Memory loss   . Parkinson disease   . Anxiety   . Dementia   . Inguinal hernia   . Arthritis   . Constipation     Past Surgical History  Procedure Laterality Date  . Hernia repair    . Back surgery    . Eye surgery Bilateral     cataract surgery with lens implant  . Eye surgery      laser surgery  . Colonoscopy    . Orif humerus fracture Left 10/27/2014    Procedure: OPEN REDUCTION INTERNAL FIXATION (ORIF) LEFT PROXIMAL HUMERUS FRACTURE;  Surgeon: Marianna Payment, MD;  Location: [facility];  Service: Orthopedics;  Laterality: Left;    There were no vitals taken for this visit.  Visit Diagnosis:  Abnormality of gait      Subjective Assessment - 02/15/15 1029    Symptoms "Im alive today"  Sometimes I have trouble with anxiety    Currently in Pain? No/denies          [facility] PT Assessment - 02/15/15 1032    Berg Balance Test   Sit to Stand Able to stand without using hands and stabilize independently   Standing Unsupported Able to stand safely 2 minutes   Sitting with Back Unsupported but Feet Supported on Floor or Stool Able to sit safely and securely 2 minutes   Stand to Sit Sits safely with minimal use of hands    Transfers Able to transfer with verbal cueing and /or supervision   Standing Unsupported with Eyes Closed Able to stand 10 seconds with supervision   Standing Ubsupported with Feet Together Able to place feet together independently and stand for 1 minute with supervision   From Standing, Reach Forward with Outstretched Arm Can reach forward >12 cm safely (5")   From Standing Position, Pick up Object from Floor Able to pick up shoe, needs supervision   From Standing Position, Turn to Look Behind Over each Shoulder Looks behind one side only/other side shows less weight shift   Turn 360 Degrees Needs close supervision or verbal cueing   Standing Unsupported, Alternately Place Feet on Step/Stool Able to complete >2 steps/needs minimal assist   Standing Unsupported, One Foot in Front Able to plae foot ahead of the other independently and hold 30 seconds   Standing on One Leg Tries to lift leg/unable to hold 3 seconds but remains standing independently   Total Score 39                  [facility] Adult PT Treatment/Exercise - 02/15/15 1032    Transfers   Transfers Sit to Stand;Stand to Sit   Sit to  Stand 7: Independent;Without upper extremity assist;From bed;From chair/3-in-1   Sit to Stand Details (indicate cue type and reason) cues for incr. forward lean   Stand to Sit 7: Independent;To bed;Without upper extremity assist   Stand to Sit Details 10 reps   Ambulation/Gait   Ambulation/Gait Yes   Ambulation/Gait Assistance 5: Supervision   Ambulation/Gait Assistance Details narrowed BOS, decreased foot  clearance, one episode of freezing with another patient walking in path of walking area   Ambulation Distance (Feet) --  1125 ft in 6 minute walk   Assistive device None   Gait Comments Gait activities practicing U-turns to sit at varied chair with verbal cues and min guard assistance   High Level Balance   High Level Balance Activities Other (comment)  Step taps forward to 6", 12" steps x  10   High Level Balance Comments Side step taps to 6" step x 10   Knee/Hip Exercises: Aerobic   Stationary Bike NuStep, level 3, 4 extremities x 6:30 for leg strengthening and flexibility                PT Education - 02/15/15 0846    Education provided Yes   Education Details walking and turning to sit   Person(s) Educated Patient;Child(ren)   Methods Explanation;Demonstration;Handout   Comprehension Verbalized understanding          PT Short Term Goals - 02/15/15 1642    PT SHORT TERM GOAL #1   Title Pt will perform HEP for improved transfers, balance, and gait, with family's assistance. (Target 02/17/15)   Status Achieved   PT SHORT TERM GOAL #2   Title Pt will perform at least 8 of 10 reps of sit<>stand transfers with minimal UE support for improved transfer efficiency and safety.   Status Achieved   PT SHORT TERM GOAL #3   Title improve Berg Balance score to at least 28/56 for decreased fall risk.   Status Achieved  Berg 34/56   PT SHORT TERM GOAL #4   Title improve TUG score to less than or equal to 35 seconds for decreased fall risk.   Baseline TUG 02/14/15 <20 seconds   Status Achieved           PT Long Term Goals - 01/18/15 1307    PT LONG TERM GOAL #1   Title Pt/family will verbalize understanding of fall prevention techniques within home environment. (Target 03/19/15)   Time 8   Period Weeks   Status New   PT LONG TERM GOAL #2   Title verbalize understanding of tips to reduce freezing episodes with gait and turns.   Time 8   Period Weeks   Status New   PT LONG TERM GOAL #3   Title improve Berg Balance score to at least 38/56 for decreased fall risk.   Time 8   Period Weeks   Status New   PT LONG TERM GOAL #4   Title improve TUG score to less than or equal to 25 seconds for decreased fall risk.   Time 8   Period Weeks   Status New   PT LONG TERM GOAL #5   Title Pt/family will verbalize understanding of continued community fitness upon D/C from  PT.   Time 8   Period Weeks   Status New               Plan - 02/15/15 1639    Clinical Impression Statement Pt has met STG #1, 2, 3, and 4.  Pt appears to be making progress with functional mobility and overall ease of movement.  Patient does still need cues for reminders of technique.  Pt/family report pt is moving back to be with daughter in [place] in the next few weeks.  Will likely discharge in the next few weeks.   Pt will benefit from skilled therapeutic intervention in order to improve on the following deficits Abnormal gait;Decreased balance;Decreased mobility;Decreased knowledge of use of DME;Decreased safety awareness;Decreased strength;Difficulty walking;Postural dysfunction   Rehab Potential Good   PT Frequency 2x / week   PT Duration 8 weeks  wk 4 of 8   PT Treatment/Interventions ADLs/Self Care Home Management;DME Instruction;Gait training;Functional mobility training;Neuromuscular re-education;Balance training;Therapeutic exercise;Therapeutic activities;Patient/family education   PT Next Visit Plan obstacle negotiation, turns, single limb stance, strengthening   Consulted and Agree with Plan of Care Patient        Problem List Patient Active Problem List   Diagnosis Date Noted  . Proximal humerus fracture 10/27/2014  . Parkinson's disease 07/26/2013  . Parkinson's disease dementia 07/26/2013  . WEIGHT LOSS, ABNORMAL 09/13/2010  . ANXIETY 09/12/2010  . PARKINSON'S DISEASE 09/12/2010  . CONSTIPATION 09/12/2010  . BACK PAIN, LUMBOSACRAL, CHRONIC 09/12/2010  . NAUSEA WITH VOMITING 09/12/2010  . PERSONAL HISTORY OF COLONIC POLYPS 09/12/2010    Tierria Watson W. 02/15/2015, 4:44 PM  Mady Haagensen, PT 02/15/2015 4:45 PM Phone: [phone] Fax: [facility] [address] [place], [place], [address] Phone: [phone]   Fax:  [phone]

## 2015-02-15 NOTE — Therapy (Signed)
[facility] [address] [place], [place], [address] Phone: [phone]   Fax:  [phone]  Physical Therapy Treatment  Patient Details  Name: Heather Mora MRN: [id number] Date of Birth: 05-Nov-1930 Referring Provider:  Barbie Banner, MD  Encounter Date: 02/14/2015      PT End of Session - 02/15/15 0846    Visit Number 7   Number of Visits 17   Date for PT Re-Evaluation 03/19/15   Authorization Type Medicare G-code every 10th visit   PT Start Time 1107   PT Stop Time 1145   PT Time Calculation (min) 38 min   Equipment Utilized During Treatment Gait belt   Activity Tolerance Patient tolerated treatment well      Past Medical History  Diagnosis Date  . Movement disorder   . Memory loss   . Parkinson disease   . Anxiety   . Dementia   . Inguinal hernia   . Arthritis   . Constipation     Past Surgical History  Procedure Laterality Date  . Hernia repair    . Back surgery    . Eye surgery Bilateral     cataract surgery with lens implant  . Eye surgery      laser surgery  . Colonoscopy    . Orif humerus fracture Left 10/27/2014    Procedure: OPEN REDUCTION INTERNAL FIXATION (ORIF) LEFT PROXIMAL HUMERUS FRACTURE;  Surgeon: [name], MD;  [facility];  Service: Orthopedics;  Laterality: Left;    There were no vitals taken for this visit.  Visit Diagnosis:  Abnormality of gait  Generalized muscle weakness      Subjective Assessment - 02/14/15 1110    Symptoms No changes, no falls   Currently in Pain? No/denies  OT addressing L UE          [facility] PT Assessment - 02/14/15 1117    Timed Up and Go Test   TUG Normal TUG   Normal TUG (seconds) 19.95                  [facility] Adult PT Treatment/Exercise - 02/14/15 1117    Ambulation/Gait   Ambulation/Gait Yes   Ambulation/Gait Assistance 4: Min guard   Ambulation/Gait Assistance Details narrowed BOS   Assistive device  None   Gait Comments Gait activities with turning to sit, using U-turn method, multiple reps, multiple chair surfaces   High Level Balance   High Level Balance Activities Side stepping;Figure 8 turns  2 x 10 ft.; U-turns to sit, multiple reps with cues   High Level Balance Comments Side step and weightshift x 10, forward step and weightshift x 10, then back step and weightshift at counter with bilateral UE support;      Review of HEP-pt demonstrates understanding with supervision.  Multiple gait activities to sit at varied chairs with cues for performing wide turning method to sit.  One episode of freezing noted.           PT Education - 02/15/15 0846    Education provided Yes   Education Details walking and turning to sit   Person(s) Educated Patient;Child(ren)   Methods Explanation;Demonstration;Handout   Comprehension Verbalized understanding          PT Short Term Goals - 02/15/15 0848    PT SHORT TERM GOAL #1   Title Pt will perform HEP for improved transfers, balance, and gait, with family's assistance. (Target 02/17/15)   Status Achieved   PT  SHORT TERM GOAL #2   Title Pt will perform at least 8 of 10 reps of sit<>stand transfers with minimal UE support for improved transfer efficiency and safety.   Status Achieved   PT SHORT TERM GOAL #3   Title improve Berg Balance score to at least 28/56 for decreased fall risk.   Status On-going   PT SHORT TERM GOAL #4   Title improve TUG score to less than or equal to 35 seconds for decreased fall risk.   Baseline TUG 02/14/15 <20 seconds   Status Achieved           PT Long Term Goals - 01/18/15 1307    PT LONG TERM GOAL #1   Title Pt/family will verbalize understanding of fall prevention techniques within home environment. (Target 03/19/15)   Time 8   Period Weeks   Status New   PT LONG TERM GOAL #2   Title verbalize understanding of tips to reduce freezing episodes with gait and turns.   Time 8   Period Weeks   Status  New   PT LONG TERM GOAL #3   Title improve Berg Balance score to at least 38/56 for decreased fall risk.   Time 8   Period Weeks   Status New   PT LONG TERM GOAL #4   Title improve TUG score to less than or equal to 25 seconds for decreased fall risk.   Time 8   Period Weeks   Status New   PT LONG TERM GOAL #5   Title Pt/family will verbalize understanding of continued community fitness upon D/C from PT.   Time 8   Period Weeks   Status New               Plan - 02/15/15 0846    Clinical Impression Statement With cues throughout session, pt is able to make improved turns in preparation for sitting when using the U-turn or half-circle technique for a wider turn to sit.  Only one episode of freezing noted during turning to sit this visit.   Pt will benefit from skilled therapeutic intervention in order to improve on the following deficits Abnormal gait;Decreased balance;Decreased mobility;Decreased knowledge of use of DME;Decreased safety awareness;Decreased strength;Difficulty walking;Postural dysfunction   Rehab Potential Good   PT Frequency 2x / week   PT Duration 8 weeks  wk 4 of 8   PT Treatment/Interventions ADLs/Self Care Home Management;DME Instruction;Gait training;Functional mobility training;Neuromuscular re-education;Balance training;Therapeutic exercise;Therapeutic activities;Patient/family education   PT Next Visit Plan obstacle negotiation, turns, review HEP; check STGs   Consulted and Agree with Plan of Care Patient        Problem List Patient Active Problem List   Diagnosis Date Noted  . Proximal humerus fracture 10/27/2014  . Parkinson's disease 07/26/2013  . Parkinson's disease dementia 07/26/2013  . WEIGHT LOSS, ABNORMAL 09/13/2010  . ANXIETY 09/12/2010  . PARKINSON'S DISEASE 09/12/2010  . CONSTIPATION 09/12/2010  . BACK PAIN, LUMBOSACRAL, CHRONIC 09/12/2010  . NAUSEA WITH VOMITING 09/12/2010  . PERSONAL HISTORY OF COLONIC POLYPS 09/12/2010     Adell Panek W. 02/15/2015, 8:49 AM  Lonia BloodAmy Brittley Regner, PT 02/15/2015 8:51 AM Phone: [phone] Fax: [phone]   [facility] [address] [place], [place], [address] Phone: [phone]   Fax:  [phone]

## 2015-02-15 NOTE — Patient Instructions (Signed)
1. Grip Strengthening (Resistive Putty)   Squeeze putty using thumb and all fingers. Repeat _20___ times. Do __2__ sessions per day.   2. Roll putty into tube on table and pinch between each finger and thumb x 10 reps each. (can do ring and small finger together)   3. Extension (Resistive Putty)   Place putty loop around fingers. Stretch loop by opening hand at large knuckles only. Keep thumb still and finger- tips straight. Repeat __15__ times. Do __2__ sessions per day.  CAN ALSO PERFORM WITH RUBBER BAND INSTEAD

## 2015-02-15 NOTE — Therapy (Signed)
[facility] [address] [place], [place], [address] Phone: [phone]   Fax:  [phone]  Occupational Therapy Treatment  Patient Details  Name: Heather Mora MRN: [id number] Date of Birth: 12/29/1929 Referring Provider:  Marianna Payment, MD  Encounter Date: 02/15/2015      OT End of Session - 02/15/15 1225    Visit Number 5   Number of Visits 17   Date for OT Re-Evaluation 03/18/15   Authorization Type MCR - G code needed   Authorization Time Period 60 days   Authorization - Visit Number 5   Authorization - Number of Visits 10   OT Start Time 1150   OT Stop Time 1230   OT Time Calculation (min) 40 min   Activity Tolerance Patient tolerated treatment well      Past Medical History  Diagnosis Date  . Movement disorder   . Memory loss   . Parkinson disease   . Anxiety   . Dementia   . Inguinal hernia   . Arthritis   . Constipation     Past Surgical History  Procedure Laterality Date  . Hernia repair    . Back surgery    . Eye surgery Bilateral     cataract surgery with lens implant  . Eye surgery      laser surgery  . Colonoscopy    . Orif humerus fracture Left 10/27/2014    Procedure: OPEN REDUCTION INTERNAL FIXATION (ORIF) LEFT PROXIMAL HUMERUS FRACTURE;  Surgeon: Marianna Payment, MD;  Location: [place];  Service: Orthopedics;  Laterality: Left;    There were no vitals taken for this visit.  Visit Diagnosis:  Stiffness of shoulder joint, left  Generalized muscle weakness      Subjective Assessment - 02/15/15 1155    Pertinent History Parkinsons disease   Currently in Pain? Yes   Pain Score 2    Pain Location Arm   Pain Orientation Left   Pain Descriptors / Indicators Dull   Pain Type Chronic pain   Pain Onset More than a month ago   Pain Frequency Intermittent   Aggravating Factors  movement   Pain Relieving Factors rest                 OT Treatments/Exercises  (OP) - 02/15/15 1202    Shoulder Exercises: ROM/Strengthening   UBE (Upper Arm Bike) x 10 min. Level 1 maintaining RPM > 30   Hand Exercises   Other Hand Exercises Pt issued red putty for Lt hand and performed: 1. mass grasp (composite finger flex), 2. tip pinch and 3. composite finger extension x 15 reps each w/ mod to max cues t/o activity required to perform correctly. (Pt shown alternative way of performing composite finger extension with rubberband to make easier for task d/t cognitive deficits)   Functional Reaching Activities   Mid Level Pt placing cones on mid level shelf requiring 90-95* LUE then removing (2 sets) with pain distal part of upper arm b/t 3-5/10.                 OT Education - 02/15/15 1215    Education provided Yes   Education Details Putty HEP (red resistance)   Person(s) Educated Patient   Methods Explanation;Demonstration;Handout   Comprehension Returned demonstration;Verbal cues required;Need further instruction          OT Short Term Goals - 02/15/15 1220    OT SHORT TERM GOAL #1   Title Independent w/  initial HEP (DUE 02/16/15)   Time 4   Period Weeks   Status Achieved  with cane HEP   OT SHORT TERM GOAL #2   Title Lt grip strength to increase to 25 lbs or greater    Baseline 20 lbs (Rt = 35 lbs)   Time 4   Period Weeks   Status Achieved  Lt grip = 29 lbs as of 02/15/15   OT SHORT TERM GOAL #3   Title Lt shoulder to perform 90* flexion for functional mid level reaching activities    Baseline 70-75*   Time 4   Period Weeks   Status Achieved  90-95* as of 02/15/15   OT SHORT TERM GOAL #4   Title Pt to report pain less than or equal to 3/10 Lt shoulder w/ ROM ex's and functional reaching    Baseline 4-5/10    Time 4   Period Weeks   Status Partially Met  pain between 3-5/10, not consistently 3/10           OT Long Term Goals - 01/18/15 1352    OT LONG TERM GOAL #1   Title Independent w/ updated HEP (due 03/18/15)   Time 8   Period  Weeks   Status New   OT LONG TERM GOAL #2   Title Pt to improve coordination as evidenced by reducing speed on 9 hole peg test to 40 sec. or less    Baseline eval = 48.16 sec.    Time 8   Period Weeks   Status New   OT LONG TERM GOAL #3   Title Pt to perfom functional reaching at 100* Lt shoulder flexion    Baseline eval = 70-75*   Time 8   Period Weeks   Status New   OT LONG TERM GOAL #4   Title Pt to perform all bathing/dressing Mod I level safely    Baseline min assist   Time 8   Period Weeks   Status New               Plan - 02/15/15 1227    Clinical Impression Statement Pt met 3 STG's. Overall decreased pain, but not consistently 3/10 or under. LUE ROM and grip strength has improved   Plan Will d/c on 02/27/15 per family request secondary to pt moving back up to [place] with other daughter for 6 months on 03/01/15. (Will need G-code)        Problem List Patient Active Problem List   Diagnosis Date Noted  . Proximal humerus fracture 10/27/2014  . Parkinson's disease 07/26/2013  . Parkinson's disease dementia 07/26/2013  . WEIGHT LOSS, ABNORMAL 09/13/2010  . ANXIETY 09/12/2010  . PARKINSON'S DISEASE 09/12/2010  . CONSTIPATION 09/12/2010  . BACK PAIN, LUMBOSACRAL, CHRONIC 09/12/2010  . NAUSEA WITH VOMITING 09/12/2010  . PERSONAL HISTORY OF COLONIC POLYPS 09/12/2010    Carey Bullocks, OTR/L 02/15/2015, 12:30 PM  [facility] [address] [place], [place], [address] Phone: [phone]   Fax:  [phone]

## 2015-02-20 ENCOUNTER — Ambulatory Visit: Payer: Medicare Other | Admitting: Physical Therapy

## 2015-02-20 ENCOUNTER — Ambulatory Visit: Payer: Medicare Other | Admitting: Occupational Therapy

## 2015-02-20 DIAGNOSIS — R269 Unspecified abnormalities of gait and mobility: Secondary | ICD-10-CM

## 2015-02-21 NOTE — Therapy (Signed)
[facility] [address] [place], [place], [address] Phone: [phone]   Fax:  [phone]  Physical Therapy Treatment  Patient Details  Name: Heather Mora MRN: [id number] Date of Birth: [birth year] Referring Provider:  Barbie BannerWilson, Fred H, MD  Encounter Date: 02/20/2015      PT End of Session - 02/21/15 1311    Visit Number 9   Number of Visits 17   Date for PT Re-Evaluation 03/19/15   Authorization Type Medicare G-code every 10th visit   PT Start Time 1317   PT Stop Time 1359   PT Time Calculation (min) 42 min   Equipment Utilized During Treatment Gait belt   Activity Tolerance Patient tolerated treatment well   Behavior During Therapy [facility] for tasks assessed/performed      Past Medical History  Diagnosis Date  . Movement disorder   . Memory loss   . Parkinson disease   . Anxiety   . Dementia   . Inguinal hernia   . Arthritis   . Constipation     Past Surgical History  Procedure Laterality Date  . Hernia repair    . Back surgery    . Eye surgery Bilateral     cataract surgery with lens implant  . Eye surgery      laser surgery  . Colonoscopy    . Orif humerus fracture Left 10/27/2014    Procedure: OPEN REDUCTION INTERNAL FIXATION (ORIF) LEFT PROXIMAL HUMERUS FRACTURE;  Surgeon: Cheral AlmasNaiping Michael Xu, MD;  [facility];  Service: Orthopedics;  Laterality: Left;    There were no vitals taken for this visit.  Visit Diagnosis:  Abnormality of gait      Subjective Assessment - 02/20/15 1318    Symptoms Having more anxiety today-planning on seeing the doctor soon about that   Currently in Pain? No/denies                    [facility] Treatment/Exercise - 02/20/15 1323    Transfers   Transfers Sit to Stand;Stand to Sit   Sit to Stand 5: Supervision;Without upper extremity assist;From bed;From chair/3-in-1  from elevated mat surface   Sit to Stand Details (indicate cue type and  reason) cues for foot placement, increased forward lean   Stand to Sit 7: Independent   Stand to Sit Details 10 reps from multiple heights   Ambulation/Gait   Ambulation/Gait Yes   Ambulation/Gait Assistance 5: Supervision   Ambulation/Gait Assistance Details narrowed base of support, decreased foot clearance   Ambulation Distance (Feet) 510 Feet   Assistive device None   Ambulation Surface Level;Indoor   Gait Comments Gait activities around gym with quick turns and changes of directions, with min guard assistance, several episodes of foot catching floor with turns   High Level Balance   High Level Balance Activities Figure 8 turns;Negotitating around obstacles;Negotiating over obstacles;Side stepping;Backward walking  using hula hoops, multiple reps; at counter   Exercises   Exercises Knee/Hip   Knee/Hip Exercises: Aerobic   Stationary Bike NuStep, level 3, 4 extremities x 8 minutes for leg strengthening and flexibility                  PT Short Term Goals - 02/15/15 1642    PT SHORT TERM GOAL #1   Title Pt will perform HEP for improved transfers, balance, and gait, with family's assistance. (Target 02/17/15)   Status Achieved   PT SHORT TERM GOAL #2   Title  Pt will perform at least 8 of 10 reps of sit<>stand transfers with minimal UE support for improved transfer efficiency and safety.   Status Achieved   PT SHORT TERM GOAL #3   Title improve Berg Balance score to at least 28/56 for decreased fall risk.   Status Achieved  Berg 34/56   PT SHORT TERM GOAL #4   Title improve TUG score to less than or equal to 35 seconds for decreased fall risk.   Baseline TUG 02/14/15 <20 seconds   Status Achieved           PT Long Term Goals - 01/18/15 1307    PT LONG TERM GOAL #1   Title Pt/family will verbalize understanding of fall prevention techniques within home environment. (Target 03/19/15)   Time 8   Period Weeks   Status New   PT LONG TERM GOAL #2   Title verbalize  understanding of tips to reduce freezing episodes with gait and turns.   Time 8   Period Weeks   Status New   PT LONG TERM GOAL #3   Title improve Berg Balance score to at least 38/56 for decreased fall risk.   Time 8   Period Weeks   Status New   PT LONG TERM GOAL #4   Title improve TUG score to less than or equal to 25 seconds for decreased fall risk.   Time 8   Period Weeks   Status New   PT LONG TERM GOAL #5   Title Pt/family will verbalize understanding of continued community fitness upon D/C from PT.   Time 8   Period Weeks   Status New               Plan - 02/21/15 1311    Clinical Impression Statement Pt less sure of herself today, per pt report.  She does well with turning and negotiating obstacles, has slight festinating steps when people come into her path.   Pt will benefit from skilled therapeutic intervention in order to improve on the following deficits Abnormal gait;Decreased balance;Decreased mobility;Decreased knowledge of use of DME;Decreased safety awareness;Decreased strength;Difficulty walking;Postural dysfunction   Rehab Potential Good   Clinical Impairments Affecting Rehab Potential Parkinson's dementia   PT Frequency 2x / week   PT Duration 8 weeks  wk 5 of 8   PT Treatment/Interventions ADLs/Self Care Home Management;DME Instruction;Gait training;Functional mobility training;Neuromuscular re-education;Balance training;Therapeutic exercise;Therapeutic activities;Patient/family education   PT Next Visit Plan likely D/C and G-code next visit   Consulted and Agree with Plan of Care Patient        Problem List Patient Active Problem List   Diagnosis Date Noted  . Proximal humerus fracture 10/27/2014  . Parkinson's disease 07/26/2013  . Parkinson's disease dementia 07/26/2013  . WEIGHT LOSS, ABNORMAL 09/13/2010  . ANXIETY 09/12/2010  . PARKINSON'S DISEASE 09/12/2010  . CONSTIPATION 09/12/2010  . BACK PAIN, LUMBOSACRAL, CHRONIC 09/12/2010  .  NAUSEA WITH VOMITING 09/12/2010  . PERSONAL HISTORY OF COLONIC POLYPS 09/12/2010    Javarie Crisp W. 02/21/2015, 1:13 PM  Lonia Blood, PT 02/21/2015 1:14 PM Phone: [phone] Fax: [phone]   [facility] [address] [place], [place], [address] Phone: [phone]   Fax:  [phone]

## 2015-02-22 ENCOUNTER — Ambulatory Visit: Payer: Medicare Other | Admitting: Physical Therapy

## 2015-02-22 ENCOUNTER — Ambulatory Visit: Payer: Medicare Other | Admitting: Occupational Therapy

## 2015-02-22 DIAGNOSIS — M6281 Muscle weakness (generalized): Secondary | ICD-10-CM

## 2015-02-22 DIAGNOSIS — M25612 Stiffness of left shoulder, not elsewhere classified: Secondary | ICD-10-CM | POA: Diagnosis not present

## 2015-02-22 DIAGNOSIS — R29898 Other symptoms and signs involving the musculoskeletal system: Secondary | ICD-10-CM

## 2015-02-22 DIAGNOSIS — R258 Other abnormal involuntary movements: Secondary | ICD-10-CM

## 2015-02-22 NOTE — Therapy (Signed)
[facility] [address] [place], [place], [address] Phone: [phone]   Fax:  [phone]  Occupational Therapy Treatment  Patient Details  Name: Heather Mora MRN: [id number] Date of Birth: [birth year] Referring Provider:  Leandrew Koyanagi, MD  Encounter Date: 02/22/2015      OT End of Session - 02/22/15 1057    Visit Number 6   Number of Visits 17   Date for OT Re-Evaluation 03/18/15   Authorization Type MCR - G code needed   Authorization Time Period 60 days   Authorization - Visit Number 6   Authorization - Number of Visits 10   Activity Tolerance Patient tolerated treatment well   Behavior During Therapy [facility] for tasks assessed/performed      Past Medical History  Diagnosis Date  . Movement disorder   . Memory loss   . Parkinson disease   . Anxiety   . Dementia   . Inguinal hernia   . Arthritis   . Constipation     Past Surgical History  Procedure Laterality Date  . Hernia repair    . Back surgery    . Eye surgery Bilateral     cataract surgery with lens implant  . Eye surgery      laser surgery  . Colonoscopy    . Orif humerus fracture Left 10/27/2014    Procedure: OPEN REDUCTION INTERNAL FIXATION (ORIF) LEFT PROXIMAL HUMERUS FRACTURE;  Surgeon: Marianna Payment, MD;  Location: [place];  Service: Orthopedics;  Laterality: Left;    There were no vitals taken for this visit.  Visit Diagnosis:  Stiffness of shoulder joint, left  Generalized muscle weakness  Bradykinesia  Rigidity      Subjective Assessment - 02/22/15 1055    Symptoms It hurts when I move it   Pertinent History Parkinsons disease   Currently in Pain? Yes   Pain Score 5    Pain Location Arm   Pain Orientation Left   Pain Descriptors / Indicators Aching   Pain Type Chronic pain   Pain Onset More than a month ago   Pain Frequency Intermittent   Aggravating Factors  movement   Pain Relieving Factors rest   Multiple  Pain Sites No       Treatment:( ther ex):cane exercises for shoulder flexion, chest press and abduction in supine x 15 reps each min v.c. PWR! Up in seated x 20 reps with therapist demonstrating.  UBE x 6 mins level 1 for conditioning, pt maintained 30-35 RPM.  Theraputic activities:Functional reaching in seated with trunk rotation to remove large pegs from pegboard with LUE, emphasis on large amplitude movements and finger extension,  min v.c.Pt fatigues quickly. Therapist stated checking progress towards LTG's.                   OT Short Term Goals - 02/15/15 1220    OT SHORT TERM GOAL #1   Title Independent w/ initial HEP (DUE 02/16/15)   Time 4   Period Weeks   Status Achieved  with cane HEP   OT SHORT TERM GOAL #2   Title Lt grip strength to increase to 25 lbs or greater    Baseline 20 lbs (Rt = 35 lbs)   Time 4   Period Weeks   Status Achieved  Lt grip = 29 lbs as of 02/15/15   OT SHORT TERM GOAL #3   Title Lt shoulder to perform 90* flexion for functional mid level reaching activities  Baseline 70-75*   Time 4   Period Weeks   Status Achieved  90-95* as of 02/15/15   OT SHORT TERM GOAL #4   Title Pt to report pain less than or equal to 3/10 Lt shoulder w/ ROM ex's and functional reaching    Baseline 4-5/10    Time 4   Period Weeks   Status Partially Met  pain between 3-5/10, not consistently 3/10           OT Long Term Goals - 02/22/15 1120    OT LONG TERM GOAL #1   Title Independent w/ updated HEP (due 03/18/15)   Status On-going   OT LONG TERM GOAL #2   Title Pt to improve coordination as evidenced by reducing speed on 9 hole peg test to 40 sec. or less    OT LONG TERM GOAL #3   Title Pt to perfom functional reaching at 100* Lt shoulder flexion    OT LONG TERM GOAL #4   Title Pt to perform all bathing/dressing Mod I level safely    Baseline min assist-02/22/15 per pt report   Status Not Met               Plan - 02/22/15 1057     Clinical Impression Statement Pt is progressing towards goals. she is limited by cognitive deficits and decreased activity tolerance.   Rehab Potential Fair   OT Frequency 2x / week   OT Duration 8 weeks   Plan check goals next week, G-code at d/c, pt plans to d/c 02/27/15 as she is moving to [place] to live with her daughter.   Consulted and Agree with Plan of Care Patient        Problem List Patient Active Problem List   Diagnosis Date Noted  . Proximal humerus fracture 10/27/2014  . Parkinson's disease 07/26/2013  . Parkinson's disease dementia 07/26/2013  . WEIGHT LOSS, ABNORMAL 09/13/2010  . ANXIETY 09/12/2010  . PARKINSON'S DISEASE 09/12/2010  . CONSTIPATION 09/12/2010  . BACK PAIN, LUMBOSACRAL, CHRONIC 09/12/2010  . NAUSEA WITH VOMITING 09/12/2010  . PERSONAL HISTORY OF COLONIC POLYPS 09/12/2010    RINE,KATHRYN 02/22/2015, 11:22 AM Theone Murdoch, OTR/L [phone] Phone: [phone] 11:22 AM 02/22/2015 [place] [address] [place], [place], [address] Phone: [phone]   Fax:  [phone]

## 2015-02-27 ENCOUNTER — Encounter: Payer: Self-pay | Admitting: Occupational Therapy

## 2015-02-27 ENCOUNTER — Ambulatory Visit: Payer: Medicare Other | Admitting: Occupational Therapy

## 2015-02-27 DIAGNOSIS — M25612 Stiffness of left shoulder, not elsewhere classified: Secondary | ICD-10-CM | POA: Diagnosis not present

## 2015-02-27 DIAGNOSIS — R258 Other abnormal involuntary movements: Secondary | ICD-10-CM

## 2015-02-27 DIAGNOSIS — M6281 Muscle weakness (generalized): Secondary | ICD-10-CM

## 2015-02-27 DIAGNOSIS — R279 Unspecified lack of coordination: Secondary | ICD-10-CM

## 2015-02-27 DIAGNOSIS — R29898 Other symptoms and signs involving the musculoskeletal system: Secondary | ICD-10-CM

## 2015-02-27 NOTE — Therapy (Signed)
[facility] [address] [place], [place], [address] Phone: [phone]   Fax:  [phone]  Occupational Therapy Treatment  Patient Details  Name: Heather Mora MRN: [id number] Date of Birth: 09-Nov-1930 Referring Provider:  Leandrew Koyanagi, MD  Encounter Date: 02/27/2015      OT End of Session - 02/27/15 1204    Visit Number 7   Number of Visits 17   Date for OT Re-Evaluation 03/18/15   Authorization Type MCR - G code needed   Authorization Time Period 60 days   Authorization - Visit Number 7   Authorization - Number of Visits 10   OT Start Time 0321   OT Stop Time 1230   OT Time Calculation (min) 38 min   Activity Tolerance Patient tolerated treatment well   Behavior During Therapy [facility] for tasks assessed/performed      Past Medical History  Diagnosis Date  . Movement disorder   . Memory loss   . Parkinson disease   . Anxiety   . Dementia   . Inguinal hernia   . Arthritis   . Constipation     Past Surgical History  Procedure Laterality Date  . Hernia repair    . Back surgery    . Eye surgery Bilateral     cataract surgery with lens implant  . Eye surgery      laser surgery  . Colonoscopy    . Orif humerus fracture Left 10/27/2014    Procedure: OPEN REDUCTION INTERNAL FIXATION (ORIF) LEFT PROXIMAL HUMERUS FRACTURE;  Surgeon: Marianna Payment, MD;  Location: [facility];  Service: Orthopedics;  Laterality: Left;    There were no vitals filed for this visit.  Visit Diagnosis:  Stiffness of shoulder joint, left  Bradykinesia  Rigidity  Generalized muscle weakness  Lack of coordination      Subjective Assessment - 02/27/15 1158    Symptoms Pt moving to [facility] Wednesday   Pertinent History Parkinsons disease   Currently in Pain? No/denies   Pain Score 7    Pain Location Arm  forearm   Pain Orientation Left   Aggravating Factors  with exercise   Pain Relieving Factors rest                     OT Treatments/Exercises (OP) - 02/27/15 0001    ADLs   ADL Comments Checked goals and discussed progress, d/c due to pt moving to [facility] with dtr   Neurological Re-education Exercises   Shoulder Flexion AAROM;10 reps;Supine;Both  cane, min v.c.   Shoulder ABduction AAROM;Both;10 reps;Supine  cane, min v.c.   Elbow Extension AAROM;Both;10 reps;Supine  cane, min v.c.   Reciprocal Movements Arm bike x80mn level 1 with min v.c for >35rpms, pt maintained 30-37rpms   Functional Reaching Activities   Mid Level to place large pegs in vertical pegboard with mod difficulty for incr ROM, activity tolerance, coordiantion                  OT Short Term Goals - 02/15/15 1220    OT SHORT TERM GOAL #1   Title Independent w/ initial HEP (DUE 02/16/15)   Time 4   Period Weeks   Status Achieved  with cane HEP   OT SHORT TERM GOAL #2   Title Lt grip strength to increase to 25 lbs or greater    Baseline 20 lbs (Rt = 35 lbs)   Time 4   Period Weeks   Status  Achieved  Lt grip = 29 lbs as of 02/15/15   OT SHORT TERM GOAL #3   Title Lt shoulder to perform 90* flexion for functional mid level reaching activities    Baseline 70-75*   Time 4   Period Weeks   Status Achieved  90-95* as of 02/15/15   OT SHORT TERM GOAL #4   Title Pt to report pain less than or equal to 3/10 Lt shoulder w/ ROM ex's and functional reaching    Baseline 4-5/10    Time 4   Period Weeks   Status Partially Met  pain between 3-5/10, not consistently 3/10           OT Long Term Goals - 29-Mar-2015 1201    OT LONG TERM GOAL #1   Title Independent w/ updated HEP (due 03/18/15)   Status Partially Met  Not fully met due to early d/c as pt moving   OT LONG TERM GOAL #2   Title Pt to improve coordination as evidenced by reducing speed on 9 hole peg test to 40 sec. or less    Status Achieved  40.03, 33.68sec 03/29/2015   OT LONG TERM GOAL #3   Title Pt to perfom functional reaching at 100* Lt  shoulder flexion    Baseline 93*-not met yet, improved from 70-75 on eval   Status Not Met  95 degrees 29-Mar-2015   OT LONG TERM GOAL #4   Title Pt to perform all bathing/dressing Mod I level safely    Baseline min assist-02/22/15 per pt report   Status Not Met               Plan - 29-Mar-2015 1202    Clinical Impression Statement All goals not met due to pt requests d/c today as she is moving to [place] with dtr.  Progress also limited cue to cognitive deficits and decreased activity tolerance.   Clinical Impairments Affecting Rehab Potential cognitive deficits   Plan d/c as pt moving Wednesday to live with daughter in [place] and Agree with Plan of Care Patient          G-Codes - March 29, 2015 1210    Functional Assessment Tool Used L shoulder flexion 95 degrees, L grip strength 29lbs, L 9-hole peg test:  40.03, 33.68sec   Functional Limitation Carrying, moving and handling objects   Carrying, Moving and Handling Objects Goal Status ([id number]) At least 20 percent but less than 40 percent impaired, limited or restricted   Carrying, Moving and Handling Objects Discharge Status [phone]) At least 40 percent but less than 60 percent impaired, limited or restricted       OCCUPATIONAL THERAPY DISCHARGE SUMMARY  Remaining deficits: Decreased ROM, decreased strength, bradykinesia, rigidity, decreased coordination for ADLs   Education / Equipment: Pt instructed in HEP.  Pt verbalized understanding of HEP.  Plan: Patient agrees to discharge.  Patient goals were partially met. Patient is being discharged due to the patient's request. pt is moving to [place] with daughter.??      Problem List Patient Active Problem List   Diagnosis Date Noted  . Proximal humerus fracture 10/27/2014  . Parkinson's disease 07/26/2013  . Parkinson's disease dementia 07/26/2013  . WEIGHT LOSS, ABNORMAL 09/13/2010  . ANXIETY 09/12/2010  . PARKINSON'S DISEASE 09/12/2010  . CONSTIPATION 09/12/2010  . BACK PAIN,  LUMBOSACRAL, CHRONIC 09/12/2010  . NAUSEA WITH VOMITING 09/12/2010  . PERSONAL HISTORY OF COLONIC POLYPS 09/12/2010    [facility] 03/29/2015, 5:37 PM  [facility] [address] [place], [place], [address] Phone: [phone]   Fax:  [place], OTR/L 02/27/2015 5:37 PM

## 2015-02-28 ENCOUNTER — Ambulatory Visit: Payer: Medicare Other | Admitting: Physical Therapy

## 2015-02-28 ENCOUNTER — Encounter: Payer: Self-pay | Admitting: Physical Therapy

## 2015-02-28 DIAGNOSIS — R269 Unspecified abnormalities of gait and mobility: Secondary | ICD-10-CM

## 2015-02-28 DIAGNOSIS — M25612 Stiffness of left shoulder, not elsewhere classified: Secondary | ICD-10-CM | POA: Diagnosis not present

## 2015-02-28 NOTE — Therapy (Signed)
[facility] [address] [place], [place], [address] Phone: [phone]   Fax:  [phone]  Physical Therapy Treatment  Patient Details  Name: Heather Mora MRN: [id number] Date of Birth: 1930/05/30 Referring Provider:  Christain Sacramento, MD  Encounter Date: 02/28/2015      PT End of Session - 02/28/15 1254    Visit Number 10   Number of Visits 17   Date for PT Re-Evaluation 03/19/15   Authorization Type Medicare G-code every 10th visit   PT Start Time 1151   PT Stop Time 1232   PT Time Calculation (min) 41 min   Equipment Utilized During Treatment Gait belt   Activity Tolerance Patient tolerated treatment well   Behavior During Therapy [facility] for tasks assessed/performed      Past Medical History  Diagnosis Date  . Movement disorder   . Memory loss   . Parkinson disease   . Anxiety   . Dementia   . Inguinal hernia   . Arthritis   . Constipation     Past Surgical History  Procedure Laterality Date  . Hernia repair    . Back surgery    . Eye surgery Bilateral     cataract surgery with lens implant  . Eye surgery      laser surgery  . Colonoscopy    . Orif humerus fracture Left 10/27/2014    Procedure: OPEN REDUCTION INTERNAL FIXATION (ORIF) LEFT PROXIMAL HUMERUS FRACTURE;  Surgeon: Marianna Payment, MD;  Location: [facility];  Service: Orthopedics;  Laterality: Left;    There were no vitals filed for this visit.  Visit Diagnosis:  Abnormality of gait      Subjective Assessment - 02/28/15 1249    Symptoms Pt states that today is her last day and she is flying to [place] tomorrow to live.  Denies falls or changes.   Currently in Pain? No/denies                       [facility] Adult PT Treatment/Exercise - 02/28/15 0001    Transfers   Transfers Sit to Stand;Stand to Sit   Sit to Stand 6: Modified independent (Device/Increase time);Without upper extremity assist;From chair/3-in-1;Five times  sit to stand   Stand to Sit 6: Modified independent (Device/Increase time)   Five time sit to stand comments  21.3   Ambulation/Gait   Gait velocity 2.37 ft/sec   Berg Balance Test   Sit to Stand Able to stand without using hands and stabilize independently   Standing Unsupported Able to stand safely 2 minutes   Sitting with Back Unsupported but Feet Supported on Floor or Stool Able to sit safely and securely 2 minutes   Stand to Sit Sits safely with minimal use of hands   Transfers Able to transfer safely, minor use of hands   Standing Unsupported with Eyes Closed Able to stand 10 seconds safely   Standing Ubsupported with Feet Together Able to place feet together independently and stand 1 minute safely   From Standing, Reach Forward with Outstretched Arm Can reach confidently >25 cm (10")   From Standing Position, Pick up Object from Floor Able to pick up shoe safely and easily   From Standing Position, Turn to Look Behind Over each Shoulder Looks behind from both sides and weight shifts well   Turn 360 Degrees Able to turn 360 degrees safely but slowly   Standing Unsupported, Alternately Place Feet on Step/Stool Able to  complete >2 steps/needs minimal assist   Standing Unsupported, One Foot in Front Able to plae foot ahead of the other independently and hold 30 seconds   Standing on One Leg Tries to lift leg/unable to hold 3 seconds but remains standing independently   Total Score 47   Timed Up and Go Test   TUG Normal TUG   Normal TUG (seconds) 21.79   High Level Balance   High Level Balance Activities Other (comment)  side stepping, side weight shift and marching at counter                PT Education - 03-18-15 1252    Education provided Yes   Education Details Fall prevention strategies, progress toward goals   Person(s) Educated Patient;Child(ren)   Methods Explanation;Handout   Comprehension Verbalized understanding          PT Short Term Goals - 02/15/15  1642    PT SHORT TERM GOAL #1   Title Pt will perform HEP for improved transfers, balance, and gait, with family's assistance. (Target 02/17/15)   Status Achieved   PT SHORT TERM GOAL #2   Title Pt will perform at least 8 of 10 reps of sit<>stand transfers with minimal UE support for improved transfer efficiency and safety.   Status Achieved   PT SHORT TERM GOAL #3   Title improve Berg Balance score to at least 28/56 for decreased fall risk.   Status Achieved  Berg 34/56   PT SHORT TERM GOAL #4   Title improve TUG score to less than or equal to 35 seconds for decreased fall risk.   Baseline TUG 02/14/15 <20 seconds   Status Achieved           PT Long Term Goals - 18-Mar-2015 1259    PT LONG TERM GOAL #1   Title Pt/family will verbalize understanding of fall prevention techniques within home environment. (Target 03/19/15)   Status Achieved   PT LONG TERM GOAL #2   Title verbalize understanding of tips to reduce freezing episodes with gait and turns.   Status Achieved   PT LONG TERM GOAL #3   Title improve Berg Balance score to at least 38/56 for decreased fall risk.   Status Achieved   PT LONG TERM GOAL #4   Title improve TUG score to less than or equal to 25 seconds for decreased fall risk.   Status Achieved   PT LONG TERM GOAL #5   Title Pt/family will verbalize understanding of continued community fitness upon D/C from PT.   Status Achieved               Plan - 03/18/15 1254    Clinical Impression Statement Pt met all LTG's.  Discussed fall prevention strategies and optimal community fitness options and provided handout for patient and daughter.  Patient plans on continuing PT in [place].   PT Next Visit Plan Discharge from PT and G-code   Consulted and Agree with Plan of Care Patient   Family Member Consulted daughter          G-Codes - 03-18-15 1301    Functional Assessment Tool Used BERG 47/56, TUG 21.79 seconds, 2.37 ft/sec gait velocity      Problem  List Patient Active Problem List   Diagnosis Date Noted  . Proximal humerus fracture 10/27/2014  . Parkinson's disease 07/26/2013  . Parkinson's disease dementia 07/26/2013  . WEIGHT LOSS, ABNORMAL 09/13/2010  . ANXIETY 09/12/2010  . PARKINSON'S DISEASE 09/12/2010  .  CONSTIPATION 09/12/2010  . BACK PAIN, LUMBOSACRAL, CHRONIC 09/12/2010  . NAUSEA WITH VOMITING 09/12/2010  . PERSONAL HISTORY OF COLONIC POLYPS 09/12/2010    Narda Bonds 02/28/2015, 1:04 PM  [facility] [address] [place], [place], [address] Phone: [phone]   Fax:  [place], [place] 02/28/2015 1:04 PM Phone: [phone] Fax: [phone]

## 2015-02-28 NOTE — Patient Instructions (Signed)
Fall Prevention and Home Safety Falls cause injuries and can affect all age groups. It is possible to use preventive measures to significantly decrease the likelihood of falls. There are many simple measures which can make your home safer and prevent falls. OUTDOORS 1. Repair cracks and edges of walkways and driveways. 2. Remove high doorway thresholds. 3. Trim shrubbery on the main path into your home. 4. Have good outside lighting. 5. Clear walkways of tools, rocks, debris, and clutter. 6. Check that handrails are not broken and are securely fastened. Both sides of steps should have handrails. 7. Have leaves, snow, and ice cleared regularly. 8. Use sand or salt on walkways during winter months. 9. In the garage, clean up grease or oil spills. BATHROOM  Install night lights.  Install grab bars by the toilet and in the tub and shower.  Use non-skid mats or decals in the tub or shower.  Place a plastic non-slip stool in the shower to sit on, if needed.  Keep floors dry and clean up all water on the floor immediately.  Remove soap buildup in the tub or shower on a regular basis.  Secure bath mats with non-slip, double-sided rug tape.  Remove throw rugs and tripping hazards from the floors. BEDROOMS  Install night lights.  Make sure a bedside light is easy to reach.  Do not use oversized bedding.  Keep a telephone by your bedside.  Have a firm chair with side arms to use for getting dressed.  Remove throw rugs and tripping hazards from the floor. KITCHEN  Keep handles on pots and pans turned toward the center of the stove. Use back burners when possible.  Clean up spills quickly and allow time for drying.  Avoid walking on wet floors.  Avoid hot utensils and knives.  Position shelves so they are not too high or low.  Place commonly used objects within easy reach.  If necessary, use a sturdy step stool with a grab bar when reaching.  Keep electrical cables out of  the way.  Do not use floor polish or wax that makes floors slippery. If you must use wax, use non-skid floor wax.  Remove throw rugs and tripping hazards from the floor. STAIRWAYS  Never leave objects on stairs.  Place handrails on both sides of stairways and use them. Fix any loose handrails. Make sure handrails on both sides of the stairways are as long as the stairs.  Check carpeting to make sure it is firmly attached along stairs. Make repairs to worn or loose carpet promptly.  Avoid placing throw rugs at the top or bottom of stairways, or properly secure the rug with carpet tape to prevent slippage. Get rid of throw rugs, if possible.  Have an electrician put in a light switch at the top and bottom of the stairs. OTHER FALL PREVENTION TIPS  Wear low-heel or rubber-soled shoes that are supportive and fit well. Wear closed toe shoes.  When using a stepladder, make sure it is fully opened and both spreaders are firmly locked. Do not climb a closed stepladder.  Add color or contrast paint or tape to grab bars and handrails in your home. Place contrasting color strips on first and last steps.  Learn and use mobility aids as needed. Install an electrical emergency response system.  Turn on lights to avoid dark areas. Replace light bulbs that burn out immediately. Get light switches that glow.  Arrange furniture to create clear pathways. Keep furniture in the same place.    Firmly attach carpet with non-skid or double-sided tape.  Eliminate uneven floor surfaces.  Select a carpet pattern that does not visually hide the edge of steps.  Be aware of all pets. OTHER HOME SAFETY TIPS  Set the water temperature for 120 F (48.8 C).  Keep emergency numbers on or near the telephone.  Keep smoke detectors on every level of the home and near sleeping areas. Document Released: 11/22/2002 Document Revised: 06/02/2012 Document Reviewed: 02/21/2012 [facility] Patient Information 2015  [employer], [place]. This information is not intended to replace advice given to you by your health care provider. Make sure you discuss any questions you have with your health care provider.   It is important to avoid accidents which may result in broken bones.  Here are a few ideas on how to make your home safer so you will be less likely to trip or fall.  10. Use nonskid mats or non slip strips in your shower or tub, on your bathroom floor and around sinks.  If you know that you have spilled water, wipe it up! 11. In the bathroom, it is important to have properly installed grab bars on the walls or on the edge of the tub.  Towel racks are NOT strong enough for you to hold onto or to pull on for support. 12. Stairs and hallways should have enough light.  Add lamps or night lights if you need ore light. 13. It is good to have handrails on both sides of the stairs if possible.  Always fix broken handrails right away. 14. It is important to see the edges of steps.  Paint the edges of outdoor steps white so you can see them better.  Put colored tape on the edge of inside steps. 15. Throw-rugs are dangerous because they can slide.  Removing the rugs is the best idea, but if they must stay, add adhesive carpet tape to prevent slipping. 16. Do not keep things on stairs or in the halls.  Remove small furniture that blocks the halls as it may cause you to trip.  Keep telephone and electrical cords out of the way where you walk. 17. Always were sturdy, rubber-soled shoes for good support.  Never wear just socks, especially on the stairs.  Socks may cause you to slip or fall.  Do not wear full-length housecoats as you can easily trip on the bottom.  18. Place the things you use the most on the shelves that are the easiest to reach.  If you use a stepstool, make sure it is in good condition.  If you feel unsteady, DO NOT climb, ask for help. 19. If a health professional advises you to use a cane or walker, do not be  ashamed.  These items can keep you from falling and breaking your bones.    Optimal Community Fitness:  Exercises from [facility]-perform daily  Walking program-5 times a week working up to 30 minutes at a time.  Start with 5 minutes if able and add time gradually each week until you can walk for 30 minutes Focus on your posture and intensity of walking including arm swing.  Cardio exercise-recumbent bike/stationary bike 3 times a week working up to 30 minutes at a time.  Again can start with 5 minutes if able and add time gradually each week until you can do for 30 minutes.

## 2015-03-01 NOTE — Therapy (Signed)
[facility] [address] [place], [place], [address] Phone: [phone]   Fax:  [phone]  Physical Therapy Treatment  Patient Details  Name: Heather Mora MRN: [id number] Date of Birth: [birth year] Referring Provider:  Christain Sacramento, MD  Encounter Date: 02/28/2015      PT End of Session - 02/28/15 1254    Visit Number 10   Number of Visits 17   Date for PT Re-Evaluation 03/19/15   Authorization Type Medicare G-code every 10th visit   PT Start Time 1151   PT Stop Time 1232   PT Time Calculation (min) 41 min   Equipment Utilized During Treatment Gait belt   Activity Tolerance Patient tolerated treatment well   Behavior During Therapy [facility] for tasks assessed/performed      Past Medical History  Diagnosis Date  . Movement disorder   . Memory loss   . Parkinson disease   . Anxiety   . Dementia   . Inguinal hernia   . Arthritis   . Constipation     Past Surgical History  Procedure Laterality Date  . Hernia repair    . Back surgery    . Eye surgery Bilateral     cataract surgery with lens implant  . Eye surgery      laser surgery  . Colonoscopy    . Orif humerus fracture Left 10/27/2014    Procedure: OPEN REDUCTION INTERNAL FIXATION (ORIF) LEFT PROXIMAL HUMERUS FRACTURE;  Surgeon: Marianna Payment, MD;  Location: [facility];  Service: Orthopedics;  Laterality: Left;    There were no vitals filed for this visit.  Visit Diagnosis:  Abnormality of gait      Subjective Assessment - 02/28/15 1249    Symptoms Pt states that today is her last day and she is flying to [place] tomorrow to live.  Denies falls or changes.   Currently in Pain? No/denies                               PT Education - 02/28/15 1252    Education provided Yes   Education Details Fall prevention strategies, progress toward goals   Person(s) Educated Patient;Child(ren)   Methods Explanation;Handout   Comprehension Verbalized understanding          PT Short Term Goals - 02/15/15 1642    PT SHORT TERM GOAL #1   Title Pt will perform HEP for improved transfers, balance, and gait, with family's assistance. (Target 02/17/15)   Status Achieved   PT SHORT TERM GOAL #2   Title Pt will perform at least 8 of 10 reps of sit<>stand transfers with minimal UE support for improved transfer efficiency and safety.   Status Achieved   PT SHORT TERM GOAL #3   Title improve Berg Balance score to at least 28/56 for decreased fall risk.   Status Achieved  Berg 34/56   PT SHORT TERM GOAL #4   Title improve TUG score to less than or equal to 35 seconds for decreased fall risk.   Baseline TUG 02/14/15 <20 seconds   Status Achieved           PT Long Term Goals - 02/28/15 1259    PT LONG TERM GOAL #1   Title Pt/family will verbalize understanding of fall prevention techniques within home environment. (Target 03/19/15)   Status Achieved   PT LONG TERM GOAL #2   Title verbalize understanding of tips  to reduce freezing episodes with gait and turns.   Status Achieved   PT LONG TERM GOAL #3   Title improve Berg Balance score to at least 38/56 for decreased fall risk.   Status Achieved   PT LONG TERM GOAL #4   Title improve TUG score to less than or equal to 25 seconds for decreased fall risk.   Status Achieved   PT LONG TERM GOAL #5   Title Pt/family will verbalize understanding of continued community fitness upon D/C from PT.   Status Achieved               Plan - Mar 08, 2015 1254    Clinical Impression Statement Pt met all LTG's.  Discussed fall prevention strategies and optimal community fitness options and provided handout for patient and daughter.  Patient plans on continuing PT in [place].   PT Next Visit Plan Discharge from PT and G-code   Consulted and Agree with Plan of Care Patient   Family Member Consulted daughter          G-Codes - 2015/03/08 1301    Functional Assessment Tool Used  BERG 47/56, TUG 21.79 seconds, 2.37 ft/sec gait velocity      Problem List Patient Active Problem List   Diagnosis Date Noted  . Proximal humerus fracture 10/27/2014  . Parkinson's disease 07/26/2013  . Parkinson's disease dementia 07/26/2013  . WEIGHT LOSS, ABNORMAL 09/13/2010  . ANXIETY 09/12/2010  . PARKINSON'S DISEASE 09/12/2010  . CONSTIPATION 09/12/2010  . BACK PAIN, LUMBOSACRAL, CHRONIC 09/12/2010  . NAUSEA WITH VOMITING 09/12/2010  . PERSONAL HISTORY OF COLONIC POLYPS 09/12/2010  Treatment provided by Nita Sells, LPTA on March 08, 2015.  PHYSICAL THERAPY DISCHARGE SUMMARY  Visits from Start of Care: 10  Current functional level related to goals / functional outcomes:     PT Long Term Goals - Mar 08, 2015 1259    PT LONG TERM GOAL #1   Title Pt/family will verbalize understanding of fall prevention techniques within home environment. (Target 03/19/15)   Status Achieved   PT LONG TERM GOAL #2   Title verbalize understanding of tips to reduce freezing episodes with gait and turns.   Status Achieved   PT LONG TERM GOAL #3   Title improve Berg Balance score to at least 38/56 for decreased fall risk.   Status Achieved   PT LONG TERM GOAL #4   Title improve TUG score to less than or equal to 25 seconds for decreased fall risk.   Status Achieved   PT LONG TERM GOAL #5   Title Pt/family will verbalize understanding of continued community fitness upon D/C from PT.   Status Achieved         PT Short Term Goals - 02/15/15 1642    PT SHORT TERM GOAL #1   Title Pt will perform HEP for improved transfers, balance, and gait, with family's assistance. (Target 02/17/15)   Status Achieved   PT SHORT TERM GOAL #2   Title Pt will perform at least 8 of 10 reps of sit<>stand transfers with minimal UE support for improved transfer efficiency and safety.   Status Achieved   PT SHORT TERM GOAL #3   Title improve Berg Balance score to at least 28/56 for decreased fall risk.   Status  Achieved  Berg 34/56   PT SHORT TERM GOAL #4   Title improve TUG score to less than or equal to 35 seconds for decreased fall risk.   Baseline TUG 02/14/15 <20 seconds   Status Achieved  Remaining deficits: Anxiety, fear of falling, freezing episodes with gait   Education / Equipment: HEP, fall prevention, community fitness options  Plan: Patient agrees to discharge.  Patient goals were met. Patient is being discharged due to meeting the stated rehab goals.  ?????Pt is moving to [place] to live with her other daughter.      Joann Kulpa W. 03/01/2015, 12:42 PM  Mady Haagensen, PT 03/01/2015 12:44 PM Phone: [phone] Fax: [facility] [address] [place], [place], [address] Phone: [phone]   Fax:  [phone]

## 2015-10-27 ENCOUNTER — Other Ambulatory Visit (HOSPITAL_COMMUNITY): Payer: Self-pay | Admitting: Psychiatry

## 2016-01-12 IMAGING — CR DG SHOULDER 2+V*L*
3 series · 3 of 3 positions shown · non-contrast
Comparison: None.

CLINICAL DATA: Status post fall tonight with a blow to the left
shoulder. Left shoulder pain limited range of motion.

EXAM:
LEFT SHOULDER - 2+ VIEW

[view not recorded (1 of 3)]
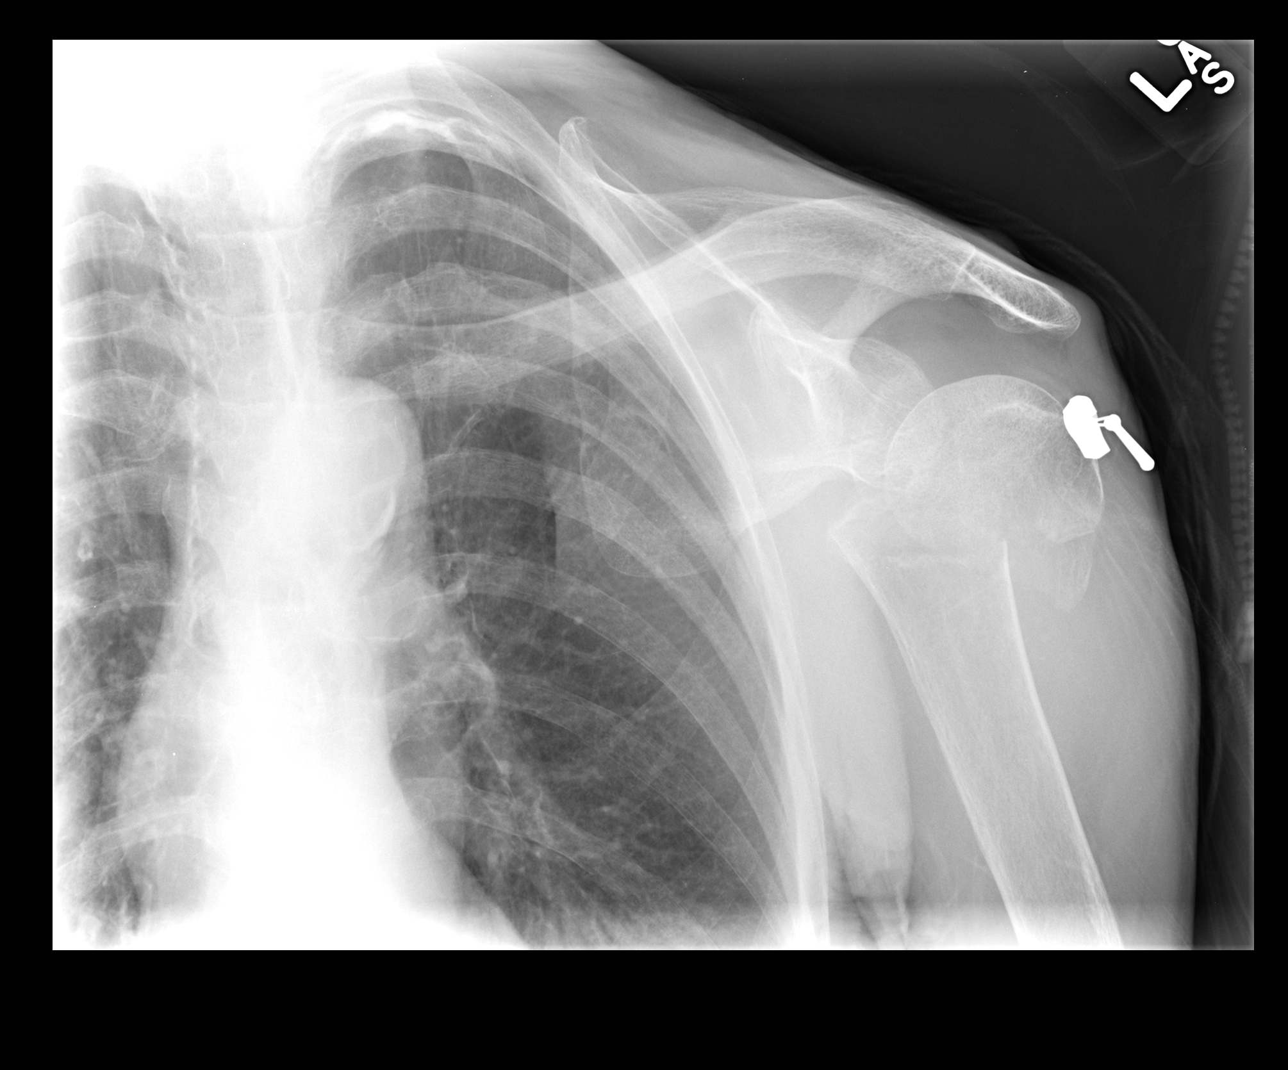

[view not recorded (2 of 3)]
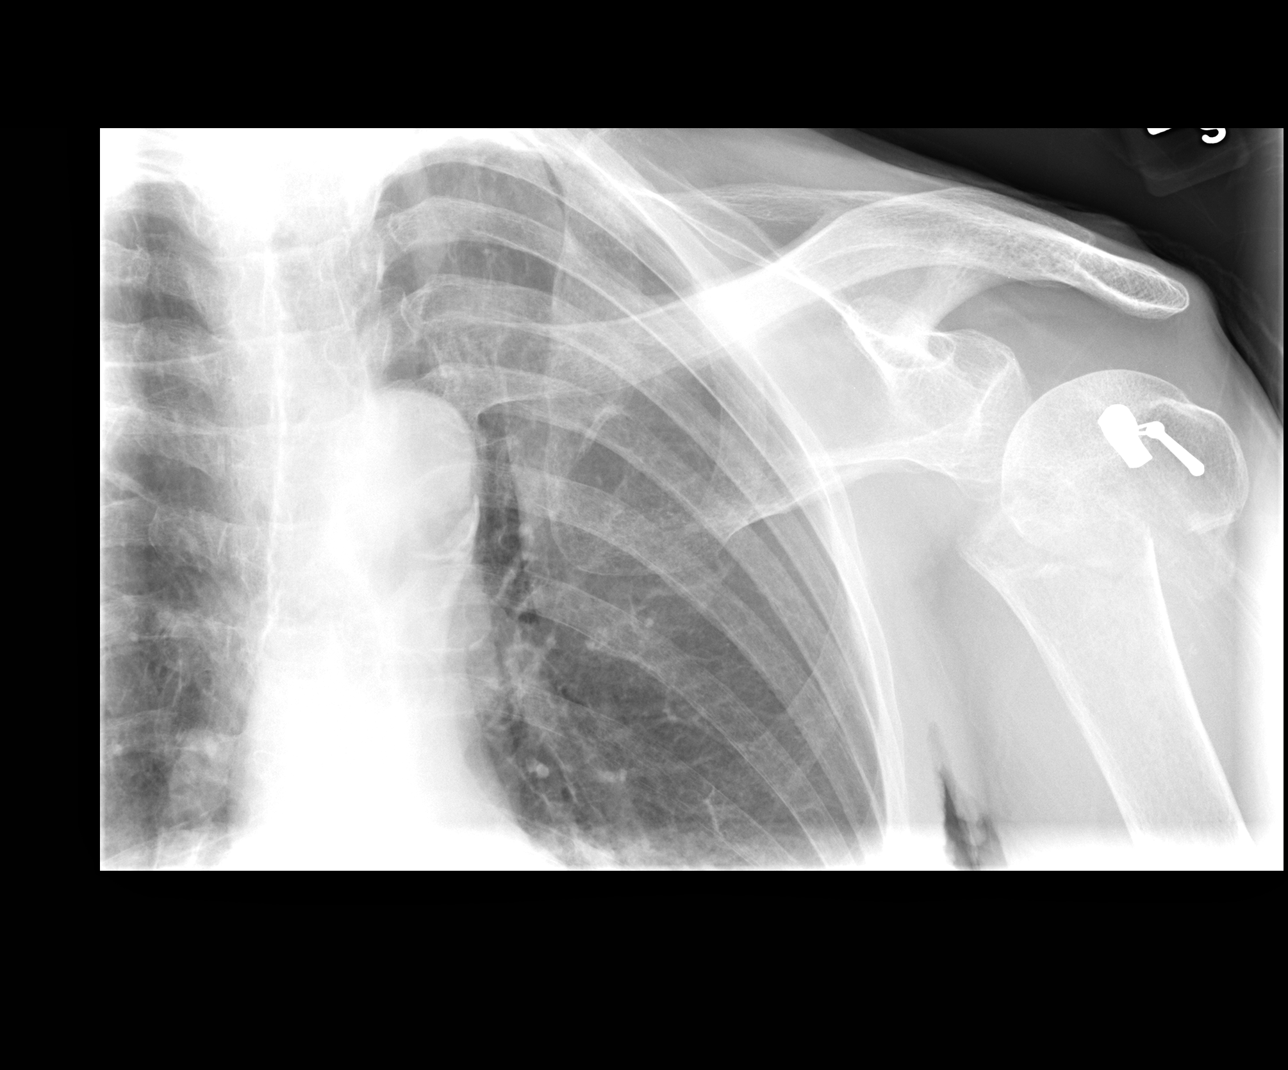

[view not recorded (3 of 3)]
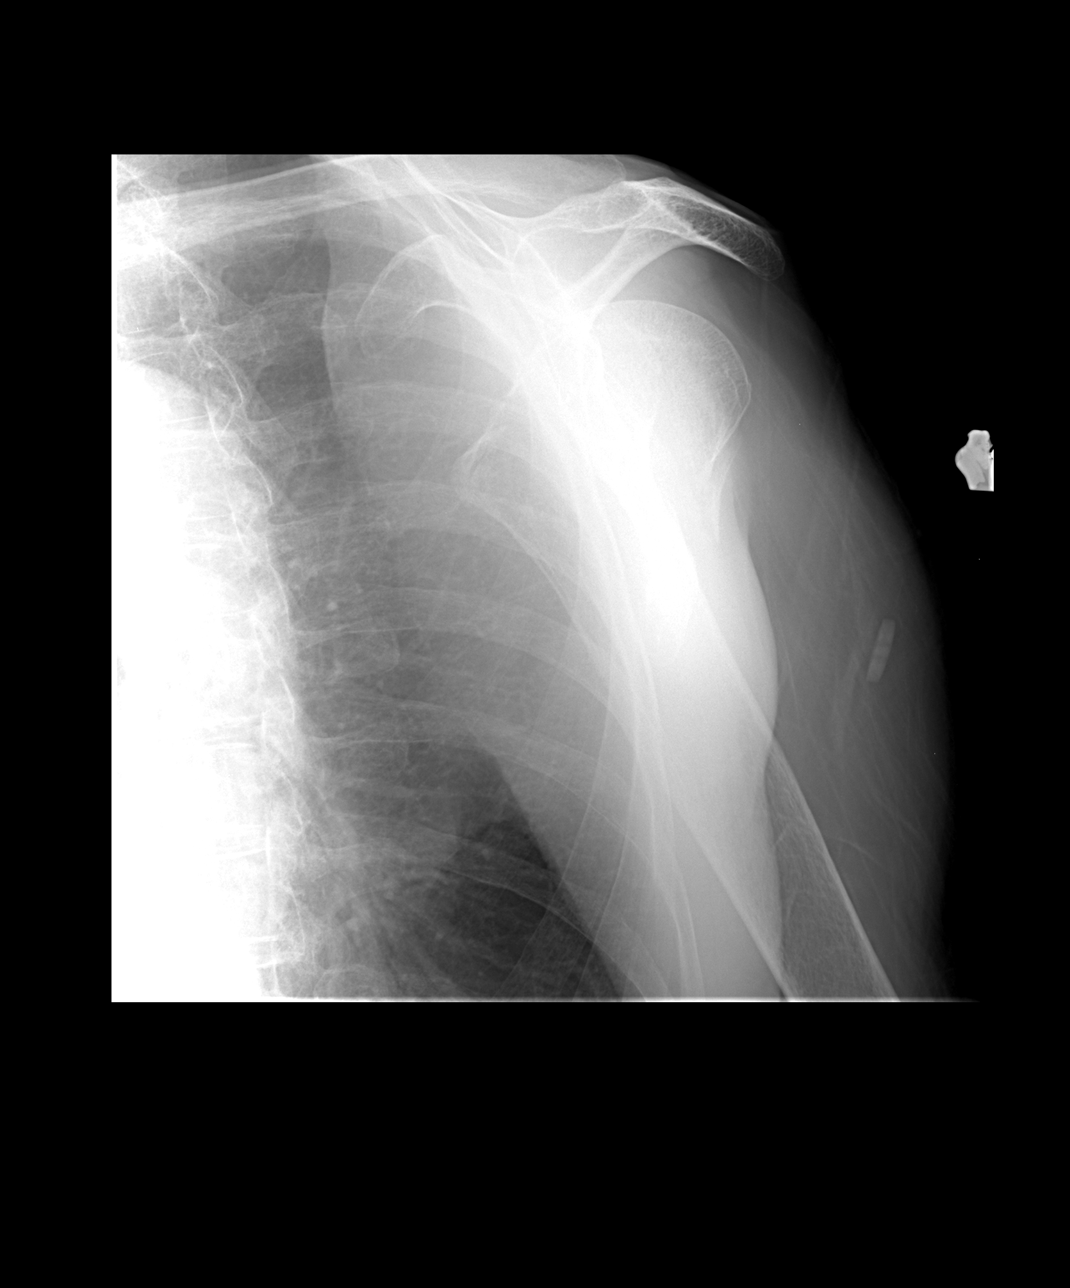

[3 of 3 positions shown; findings below may reference images not displayed]

FINDINGS: The patient has an acute surgical neck fracture of the left humerus.
There is approximately 1 shaft width anterior and 3.5 cm superior
displacement of the distal fragment. The fracture does not appear to
involve the tuberosities. The humeral head is located. The
acromioclavicular joint is intact. Imaged left lung and ribs appear
normal.
IMPRESSION: Acute surgical neck fracture left humerus as described.

## 2019-02-14 DEATH — deceased
